# Patient Record
Sex: Female | Born: 1985 | Race: White | Hispanic: No | Marital: Married | Smoking: Former smoker
Health system: Southern US, Community
[De-identification: ages and names within clinical notes are randomized; demographics above are authoritative.]

## PROBLEM LIST (undated history)

## (undated) DIAGNOSIS — R059 Cough, unspecified: Secondary | ICD-10-CM

## (undated) DIAGNOSIS — S42009A Fracture of unspecified part of unspecified clavicle, initial encounter for closed fracture: Secondary | ICD-10-CM

## (undated) DIAGNOSIS — M7989 Other specified soft tissue disorders: Secondary | ICD-10-CM

## (undated) DIAGNOSIS — S62102A Fracture of unspecified carpal bone, left wrist, initial encounter for closed fracture: Secondary | ICD-10-CM

## (undated) DIAGNOSIS — R1110 Vomiting, unspecified: Secondary | ICD-10-CM

## (undated) DIAGNOSIS — R6883 Chills (without fever): Secondary | ICD-10-CM

## (undated) DIAGNOSIS — S62101A Fracture of unspecified carpal bone, right wrist, initial encounter for closed fracture: Secondary | ICD-10-CM

## (undated) DIAGNOSIS — R05 Cough: Secondary | ICD-10-CM

## (undated) DIAGNOSIS — J029 Acute pharyngitis, unspecified: Secondary | ICD-10-CM

## (undated) DIAGNOSIS — R531 Weakness: Secondary | ICD-10-CM

## (undated) HISTORY — DX: Chills (without fever): R68.83

## (undated) HISTORY — PX: NO PAST SURGERIES: SHX2092

## (undated) HISTORY — DX: Acute pharyngitis, unspecified: J02.9

## (undated) HISTORY — DX: Other specified soft tissue disorders: M79.89

## (undated) HISTORY — DX: Cough, unspecified: R05.9

## (undated) HISTORY — DX: Vomiting, unspecified: R11.10

## (undated) HISTORY — DX: Cough: R05

## (undated) HISTORY — DX: Weakness: R53.1

---
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
## 2010-09-30 ENCOUNTER — Ambulatory Visit: Payer: Self-pay | Admitting: Family Medicine

## 2011-06-16 ENCOUNTER — Emergency Department (HOSPITAL_COMMUNITY): Payer: No Typology Code available for payment source

## 2011-06-16 ENCOUNTER — Inpatient Hospital Stay (HOSPITAL_COMMUNITY)
Admission: EM | Admit: 2011-06-16 | Discharge: 2011-06-26 | DRG: 958 | Disposition: A | Discharge status: Home / Self Care | Payer: No Typology Code available for payment source | Attending: General Surgery | Admitting: General Surgery

## 2011-06-16 ENCOUNTER — Inpatient Hospital Stay (HOSPITAL_COMMUNITY): Payer: No Typology Code available for payment source

## 2011-06-16 ENCOUNTER — Encounter: Payer: Self-pay | Admitting: *Deleted

## 2011-06-16 DIAGNOSIS — S42002A Fracture of unspecified part of left clavicle, initial encounter for closed fracture: Secondary | ICD-10-CM | POA: Diagnosis present

## 2011-06-16 DIAGNOSIS — S83509A Sprain of unspecified cruciate ligament of unspecified knee, initial encounter: Secondary | ICD-10-CM | POA: Diagnosis present

## 2011-06-16 DIAGNOSIS — S069X9A Unspecified intracranial injury with loss of consciousness of unspecified duration, initial encounter: Secondary | ICD-10-CM | POA: Diagnosis present

## 2011-06-16 DIAGNOSIS — M25539 Pain in unspecified wrist: Secondary | ICD-10-CM | POA: Diagnosis present

## 2011-06-16 DIAGNOSIS — S069XAA Unspecified intracranial injury with loss of consciousness status unknown, initial encounter: Secondary | ICD-10-CM | POA: Diagnosis present

## 2011-06-16 DIAGNOSIS — S0180XA Unspecified open wound of other part of head, initial encounter: Secondary | ICD-10-CM

## 2011-06-16 DIAGNOSIS — S0291XB Unspecified fracture of skull, initial encounter for open fracture: Secondary | ICD-10-CM | POA: Diagnosis present

## 2011-06-16 DIAGNOSIS — S83519A Sprain of anterior cruciate ligament of unspecified knee, initial encounter: Secondary | ICD-10-CM | POA: Diagnosis present

## 2011-06-16 DIAGNOSIS — S32009A Unspecified fracture of unspecified lumbar vertebra, initial encounter for closed fracture: Secondary | ICD-10-CM | POA: Diagnosis present

## 2011-06-16 DIAGNOSIS — D62 Acute posthemorrhagic anemia: Secondary | ICD-10-CM | POA: Diagnosis not present

## 2011-06-16 DIAGNOSIS — S02109B Fracture of base of skull, unspecified side, initial encounter for open fracture: Principal | ICD-10-CM | POA: Diagnosis present

## 2011-06-16 DIAGNOSIS — Y998 Other external cause status: Secondary | ICD-10-CM

## 2011-06-16 DIAGNOSIS — S0100XA Unspecified open wound of scalp, initial encounter: Secondary | ICD-10-CM | POA: Diagnosis present

## 2011-06-16 DIAGNOSIS — S272XXA Traumatic hemopneumothorax, initial encounter: Secondary | ICD-10-CM | POA: Diagnosis present

## 2011-06-16 DIAGNOSIS — S22009A Unspecified fracture of unspecified thoracic vertebra, initial encounter for closed fracture: Secondary | ICD-10-CM | POA: Diagnosis present

## 2011-06-16 DIAGNOSIS — S2249XA Multiple fractures of ribs, unspecified side, initial encounter for closed fracture: Secondary | ICD-10-CM

## 2011-06-16 DIAGNOSIS — S01309A Unspecified open wound of unspecified ear, initial encounter: Secondary | ICD-10-CM | POA: Diagnosis present

## 2011-06-16 DIAGNOSIS — Y92410 Unspecified street and highway as the place of occurrence of the external cause: Secondary | ICD-10-CM

## 2011-06-16 DIAGNOSIS — S2239XA Fracture of one rib, unspecified side, initial encounter for closed fracture: Secondary | ICD-10-CM

## 2011-06-16 DIAGNOSIS — S42009A Fracture of unspecified part of unspecified clavicle, initial encounter for closed fracture: Secondary | ICD-10-CM | POA: Diagnosis present

## 2011-06-16 DIAGNOSIS — S01409A Unspecified open wound of unspecified cheek and temporomandibular area, initial encounter: Secondary | ICD-10-CM | POA: Diagnosis present

## 2011-06-16 DIAGNOSIS — S27329A Contusion of lung, unspecified, initial encounter: Secondary | ICD-10-CM | POA: Diagnosis present

## 2011-06-16 DIAGNOSIS — J939 Pneumothorax, unspecified: Secondary | ICD-10-CM | POA: Diagnosis present

## 2011-06-16 HISTORY — DX: Fracture of unspecified part of unspecified clavicle, initial encounter for closed fracture: S42.009A

## 2011-06-16 HISTORY — DX: Fracture of unspecified carpal bone, right wrist, initial encounter for closed fracture: S62.101A

## 2011-06-16 HISTORY — DX: Fracture of unspecified carpal bone, right wrist, initial encounter for closed fracture: S62.102A

## 2011-06-16 LAB — POCT I-STAT, CHEM 8
BUN: 13 mg/dL (ref 6–23)
Chloride: 107 mEq/L (ref 96–112)
Creatinine, Ser: 0.7 mg/dL (ref 0.50–1.10)
Hemoglobin: 13.9 g/dL (ref 12.0–15.0)
Potassium: 3.6 mEq/L (ref 3.5–5.1)
Sodium: 141 mEq/L (ref 135–145)

## 2011-06-16 LAB — COMPREHENSIVE METABOLIC PANEL
ALT: 75 U/L — ABNORMAL HIGH (ref 0–35)
Alkaline Phosphatase: 48 U/L (ref 39–117)
BUN: 13 mg/dL (ref 6–23)
CO2: 23 mEq/L (ref 19–32)
Calcium: 8.2 mg/dL — ABNORMAL LOW (ref 8.4–10.5)
GFR calc Af Amer: >90 mL/min (ref >90)
GFR calc non Af Amer: >90 mL/min (ref >90)
Glucose, Bld: 144 mg/dL — ABNORMAL HIGH (ref 70–99)
Sodium: 139 mEq/L (ref 135–145)
Total Protein: 6.3 g/dL (ref 6.0–8.3)

## 2011-06-16 LAB — CBC
Hemoglobin: 13.3 g/dL (ref 12.0–15.0)
MCHC: 34.1 g/dL (ref 30.0–36.0)
RDW: 12.5 % (ref 11.5–15.5)
WBC: 15.3 10*3/uL — ABNORMAL HIGH (ref 4.0–10.5)

## 2011-06-16 LAB — URINALYSIS, ROUTINE W REFLEX MICROSCOPIC
Bilirubin Urine: NEGATIVE
Glucose, UA: NEGATIVE mg/dL
Specific Gravity, Urine: 1.035 — ABNORMAL HIGH (ref 1.005–1.030)

## 2011-06-16 LAB — PROTIME-INR
INR: 1.23 (ref 0.00–1.49)
Prothrombin Time: 15.8 seconds — ABNORMAL HIGH (ref 11.6–15.2)

## 2011-06-16 LAB — URINE MICROSCOPIC-ADD ON

## 2011-06-16 LAB — SAMPLE TO BLOOD BANK

## 2011-06-16 LAB — POCT PREGNANCY, URINE: Preg Test, Ur: NEGATIVE

## 2011-06-16 MED ORDER — DIPHENHYDRAMINE HCL 50 MG/ML IJ SOLN
12.5000 mg | Freq: Four times a day (QID) | INTRAMUSCULAR | Status: DC | PRN
  Administered 2011-06-16: 12.5 mg via INTRAVENOUS
  Filled 2011-06-16: qty 1

## 2011-06-16 MED ORDER — PANTOPRAZOLE SODIUM 40 MG PO TBEC
40.0000 mg | DELAYED_RELEASE_TABLET | Freq: Every day | ORAL | Status: DC
  Filled 2011-06-16: qty 1

## 2011-06-16 MED ORDER — CEFAZOLIN SODIUM 1-5 GM-% IV SOLN: 1.0000 g | INTRAVENOUS | Status: DC

## 2011-06-16 MED ORDER — ONDANSETRON HCL 4 MG/2ML IJ SOLN
4.0000 mg | Freq: Four times a day (QID) | INTRAMUSCULAR | Status: DC | PRN
  Administered 2011-06-16: 4 mg via INTRAVENOUS

## 2011-06-16 MED ORDER — FENTANYL CITRATE 0.05 MG/ML IJ SOLN
50.0000 ug | Freq: Once | INTRAMUSCULAR | Status: AC
  Administered 2011-06-16: 50 ug via INTRAVENOUS

## 2011-06-16 MED ORDER — VANCOMYCIN HCL IN DEXTROSE 1-5 GM/200ML-% IV SOLN
1000.0000 mg | Freq: Three times a day (TID) | INTRAVENOUS | Status: AC
  Administered 2011-06-16 – 2011-06-18 (×5): 1000 mg via INTRAVENOUS
  Filled 2011-06-16 (×5): qty 200

## 2011-06-16 MED ORDER — ONDANSETRON HCL 4 MG PO TABS
4.0000 mg | ORAL_TABLET | Freq: Four times a day (QID) | ORAL | Status: DC | PRN
  Administered 2011-06-20: 4 mg via ORAL
  Filled 2011-06-16: qty 1

## 2011-06-16 MED ORDER — ONDANSETRON HCL 4 MG/2ML IJ SOLN
4.0000 mg | Freq: Four times a day (QID) | INTRAMUSCULAR | Status: DC | PRN
  Administered 2011-06-17 (×3): 4 mg via INTRAVENOUS
  Filled 2011-06-16 (×3): qty 2

## 2011-06-16 MED ORDER — VANCOMYCIN HCL IN DEXTROSE 1-5 GM/200ML-% IV SOLN: 1000.0000 mg | INTRAVENOUS | Status: AC

## 2011-06-16 MED ORDER — KCL IN DEXTROSE-NACL 20-5-0.9 MEQ/L-%-% IV SOLN
INTRAVENOUS | Status: DC
  Administered 2011-06-17 (×2): via INTRAVENOUS
  Administered 2011-06-17 – 2011-06-18 (×2): 125 mL/h via INTRAVENOUS
  Administered 2011-06-18: 18:00:00 via INTRAVENOUS
  Administered 2011-06-19: 125 mL/h via INTRAVENOUS
  Filled 2011-06-16 (×10): qty 1000

## 2011-06-16 MED ORDER — PANTOPRAZOLE SODIUM 40 MG IV SOLR: 40.0000 mg | Freq: Every day | INTRAVENOUS | Status: DC

## 2011-06-16 MED ORDER — PANTOPRAZOLE SODIUM 40 MG IV SOLR
40.0000 mg | Freq: Every day | INTRAVENOUS | Status: DC
  Administered 2011-06-16 – 2011-06-17 (×2): 40 mg via INTRAVENOUS
  Filled 2011-06-16 (×5): qty 40

## 2011-06-16 MED ORDER — HYDROMORPHONE HCL PF 1 MG/ML IJ SOLN
1.0000 mg | Freq: Once | INTRAMUSCULAR | Status: AC
  Administered 2011-06-16 (×2): 1 mg via INTRAVENOUS
  Filled 2011-06-16: qty 1

## 2011-06-16 MED ORDER — TETANUS-DIPHTH-ACELL PERTUSSIS 5-2.5-18.5 LF-MCG/0.5 IM SUSP
0.5000 mL | Freq: Once | INTRAMUSCULAR | Status: AC
  Administered 2011-06-16: 0.5 mL via INTRAMUSCULAR
  Filled 2011-06-16: qty 0.5

## 2011-06-16 MED ORDER — MORPHINE SULFATE (PF) 1 MG/ML IV SOLN
INTRAVENOUS | Status: DC
  Administered 2011-06-16: 19:00:00 via INTRAVENOUS
  Administered 2011-06-16: 3 mg via INTRAVENOUS
  Administered 2011-06-17: 13.5 mg via INTRAVENOUS
  Administered 2011-06-17: 11 mg via INTRAVENOUS
  Administered 2011-06-17 (×2): via INTRAVENOUS
  Administered 2011-06-17: 9 mg via INTRAVENOUS
  Administered 2011-06-17: 16.5 mg via INTRAVENOUS
  Administered 2011-06-18: 16:00:00 via INTRAVENOUS
  Administered 2011-06-18: 3 mg via INTRAVENOUS
  Administered 2011-06-18: 13.5 mg via INTRAVENOUS
  Administered 2011-06-18: 4.5 mg via INTRAVENOUS
  Administered 2011-06-18 – 2011-06-19 (×2): via INTRAVENOUS
  Administered 2011-06-19 (×2): 15 mg via INTRAVENOUS
  Administered 2011-06-19: 7.1 mg via INTRAVENOUS
  Filled 2011-06-16 (×7): qty 25

## 2011-06-16 MED ORDER — ONDANSETRON HCL 4 MG/2ML IJ SOLN
INTRAMUSCULAR | Status: AC
  Filled 2011-06-16: qty 2

## 2011-06-16 MED ORDER — SODIUM CHLORIDE 0.9 % IJ SOLN: 9.0000 mL | INTRAMUSCULAR | Status: DC | PRN

## 2011-06-16 MED ORDER — PANTOPRAZOLE SODIUM 40 MG PO TBEC: 40.0000 mg | DELAYED_RELEASE_TABLET | Freq: Every day | ORAL | Status: DC

## 2011-06-16 MED ORDER — NALOXONE HCL 0.4 MG/ML IJ SOLN: 0.4000 mg | INTRAMUSCULAR | Status: DC | PRN

## 2011-06-16 MED ORDER — HYDROMORPHONE HCL PF 1 MG/ML IJ SOLN
1.0000 mg | INTRAMUSCULAR | Status: DC | PRN
  Administered 2011-06-16: 1 mg via INTRAVENOUS
  Filled 2011-06-16 (×2): qty 1

## 2011-06-16 MED ORDER — DIPHENHYDRAMINE HCL 12.5 MG/5ML PO ELIX
12.5000 mg | ORAL_SOLUTION | Freq: Four times a day (QID) | ORAL | Status: DC | PRN
  Filled 2011-06-16: qty 5

## 2011-06-16 MED ORDER — SODIUM CHLORIDE 0.9 % IV SOLN: Freq: Once | INTRAVENOUS | Status: DC

## 2011-06-16 MED ORDER — IOHEXOL 300 MG/ML  SOLN
100.0000 mL | Freq: Once | INTRAMUSCULAR | Status: AC | PRN
  Administered 2011-06-16: 100 mL via INTRAVENOUS

## 2011-06-16 MED ORDER — FENTANYL CITRATE 0.05 MG/ML IJ SOLN
50.0000 ug | Freq: Once | INTRAMUSCULAR | Status: AC
  Administered 2011-06-16: 50 ug via INTRAVENOUS
  Filled 2011-06-16: qty 2

## 2011-06-16 MED ORDER — VANCOMYCIN HCL IN DEXTROSE 1-5 GM/200ML-% IV SOLN: 1000.0000 mg | Freq: Three times a day (TID) | INTRAVENOUS | Status: DC
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
## 2011-06-16 NOTE — ED Notes (Signed)
Dr. Newman at bedside. 
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
## 2011-06-16 NOTE — ED Notes (Signed)
Report called to 2300. MD Ezzard Standing at bedside repairing laceration to head. Will transport pt after repair is finished.
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
## 2011-06-16 NOTE — Progress Notes (Signed)
Paged Dr. Andrey Campanile to clarify imaging, activity, and diet orders. PCXR all right to be done in AM before going to radiology for CT of head and flexion/extension of cervical spine. Okay for flex/ext order to be moved to the AM to be done with CT of head. No activity orders except for bedrest in. Order given to elevate head of bed and to logroll patient. No lumbar precautions necessary per MD. Patient put on a clear liquid diet. Her primary complaint since admission to 2300 has been hunger.

## 2011-06-16 NOTE — Consult Note (Signed)
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 shaft fracture, OTA 15-B3  Given profound comminution as well as ipsilateral rib fractures we would strongly recommend open reduction internal fixation to help restore stability length and alignment.  We did review with the patient that allowing the clavicle to heal in its current position or even a more shortened position she likely sustained decreased endurance and strength in the extremity.  Given the multiple ipsilateral rib fractures with anticipate further displacement over the next several days interim or unacceptable position.  Also patient is left-hand dominant which would also favor operative fixation at this time.  We will obtain a dedicated clavicle x-rays to fully evaluate as well.  Patient can have a sling for comfort  Ice as needed for swelling and pain control. 3. suspected right knee anterior cruciate ligament and MCL injury  Patient's clinical exam as well as radiographic findings are concerning for injuries to both the anterior cruciate ligament as well as the MCL  We will obtain an MRI once patient is stable enough to do so of the right knee to evaluate for soft tissue injury.   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 4. Back and pelvic pain  Initial clinical exam was concerning for possible pelvic ring injury  No evidence of a sacral fracture or rami fractures are noted.  Is possible that the left L2-L4 transverse processes fractures are reflective of the iliopsoas avulsion injury and could explain her back pain.  4. DVT PE prophylaxis  Per trauma service 5. Activity  Bed rest 6. Disposition  Followup left clavicle films  Eventual MRI right knee later on during admission  Admission for close observation  If patient elects to have surgery, we will perform likely Thursday.    Mearl Latin, PA-C  06/16/2011, 1:45 PM
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
## 2011-06-16 NOTE — ED Notes (Signed)
MD at bedside. 

## 2011-06-16 NOTE — ED Notes (Signed)
Trauma MD at bedside.
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
## 2011-06-17 ENCOUNTER — Inpatient Hospital Stay (HOSPITAL_COMMUNITY): Payer: No Typology Code available for payment source

## 2011-06-17 ENCOUNTER — Encounter (HOSPITAL_COMMUNITY): Payer: Self-pay | Admitting: Radiology

## 2011-06-17 DIAGNOSIS — D62 Acute posthemorrhagic anemia: Secondary | ICD-10-CM | POA: Diagnosis not present

## 2011-06-17 LAB — BASIC METABOLIC PANEL
BUN: 4 mg/dL — ABNORMAL LOW (ref 6–23)
Calcium: 7.9 mg/dL — ABNORMAL LOW (ref 8.4–10.5)
Creatinine, Ser: 0.52 mg/dL (ref 0.50–1.10)
GFR calc non Af Amer: >90 mL/min (ref >90)
Glucose, Bld: 139 mg/dL — ABNORMAL HIGH (ref 70–99)

## 2011-06-17 LAB — CBC
HCT: 33.1 % — ABNORMAL LOW (ref 36.0–46.0)
Hemoglobin: 11.4 g/dL — ABNORMAL LOW (ref 12.0–15.0)
MCH: 31.9 pg (ref 26.0–34.0)
MCHC: 34.4 g/dL (ref 30.0–36.0)
RDW: 12.6 % (ref 11.5–15.5)

## 2011-06-17 LAB — VANCOMYCIN, TROUGH: Vancomycin Tr: 11.7 ug/mL (ref 10.0–20.0)

## 2011-06-17 MED ORDER — METAXALONE 800 MG PO TABS
800.0000 mg | ORAL_TABLET | Freq: Four times a day (QID) | ORAL | Status: DC
  Administered 2011-06-17 – 2011-06-19 (×6): 800 mg via ORAL
  Filled 2011-06-17 (×12): qty 1

## 2011-06-17 MED ORDER — VANCOMYCIN HCL IN DEXTROSE 1-5 GM/200ML-% IV SOLN
1000.0000 mg | INTRAVENOUS | Status: AC
  Filled 2011-06-17: qty 200

## 2011-06-17 MED ORDER — OXYCODONE HCL 5 MG PO TABS
10.0000 mg | ORAL_TABLET | ORAL | Status: DC | PRN
  Administered 2011-06-17: 10 mg via ORAL
  Administered 2011-06-19 (×2): 5 mg via ORAL
  Administered 2011-06-20 (×2): 10 mg via ORAL
  Filled 2011-06-17 (×2): qty 2
  Filled 2011-06-17: qty 1
  Filled 2011-06-17 (×3): qty 2

## 2011-06-17 MED ORDER — MORPHINE SULFATE 4 MG/ML IJ SOLN
4.0000 mg | INTRAMUSCULAR | Status: DC | PRN
  Administered 2011-06-17: 2 mg via INTRAVENOUS
  Administered 2011-06-19 – 2011-06-26 (×6): 4 mg via INTRAVENOUS
  Filled 2011-06-17 (×8): qty 1

## 2011-06-17 MED ORDER — BACITRACIN ZINC 500 UNIT/GM EX OINT
TOPICAL_OINTMENT | CUTANEOUS | Status: DC | PRN
  Filled 2011-06-17: qty 15

## 2011-06-17 MED ORDER — VANCOMYCIN HCL IN DEXTROSE 1-5 GM/200ML-% IV SOLN: 1000.0000 mg | Freq: Once | INTRAVENOUS | Status: DC

## 2011-06-17 MED ORDER — TRAMADOL HCL 50 MG PO TABS
100.0000 mg | ORAL_TABLET | Freq: Four times a day (QID) | ORAL | Status: DC
  Administered 2011-06-17 – 2011-06-22 (×19): 100 mg via ORAL
  Administered 2011-06-22: 50 mg via ORAL
  Administered 2011-06-22 – 2011-06-26 (×15): 100 mg via ORAL
  Filled 2011-06-17 (×19): qty 2
  Filled 2011-06-17: qty 1
  Filled 2011-06-17 (×9): qty 2
  Filled 2011-06-17: qty 1
  Filled 2011-06-17 (×12): qty 2

## 2011-06-17 MED ORDER — VANCOMYCIN HCL IN DEXTROSE 1-5 GM/200ML-% IV SOLN
1000.0000 mg | Freq: Three times a day (TID) | INTRAVENOUS | Status: DC
  Administered 2011-06-17 – 2011-06-21 (×11): 1000 mg via INTRAVENOUS
  Filled 2011-06-17 (×12): qty 200

## 2011-06-17 MED ORDER — BACITRACIN ZINC 500 UNIT/GM EX OINT
TOPICAL_OINTMENT | Freq: Two times a day (BID) | CUTANEOUS | Status: DC
  Administered 2011-06-17: 1 via TOPICAL
  Administered 2011-06-17: 12:00:00 via TOPICAL
  Administered 2011-06-18: 1 via TOPICAL
  Administered 2011-06-18 – 2011-06-26 (×15): via TOPICAL
  Filled 2011-06-17 (×3): qty 15
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
## 2011-06-17 NOTE — Progress Notes (Signed)
Patient back from radiology with RN and transporter. CT of head and chest xray completed. Patient unable to complete flexion and extension images of cervical spine due to pain and anxiety r/t the movement of the head of the bed. Patient reached a 45 degree angle and refused anymore movement upward. Patient c/o pain with moving during CT and CXR, but overall tolerated well. Placed on 3L nasal cannula during CT. Transported on RA in bed, VSS stable, zofran given for nausea during transport, and Morphine PCA utilized for pain.
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
## 2011-06-17 NOTE — Progress Notes (Signed)
Remain in ICU  Wilmon Arms. Corliss Skains, MD, [facility]  06/17/2011 11:52 AM
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
## 2011-06-18 ENCOUNTER — Inpatient Hospital Stay (HOSPITAL_COMMUNITY): Payer: No Typology Code available for payment source

## 2011-06-18 ENCOUNTER — Encounter (HOSPITAL_COMMUNITY): Admission: EM | Disposition: A | Discharge status: Home / Self Care | Payer: Self-pay | Source: Home / Self Care

## 2011-06-18 ENCOUNTER — Encounter (HOSPITAL_COMMUNITY): Payer: Self-pay | Admitting: Anesthesiology

## 2011-06-18 ENCOUNTER — Inpatient Hospital Stay (HOSPITAL_COMMUNITY): Payer: No Typology Code available for payment source | Admitting: Anesthesiology

## 2011-06-18 HISTORY — PX: ORIF CLAVICULAR FRACTURE: SHX5055

## 2011-06-18 LAB — BASIC METABOLIC PANEL
BUN: 4 mg/dL — ABNORMAL LOW (ref 6–23)
Chloride: 103 mEq/L (ref 96–112)
Creatinine, Ser: 0.56 mg/dL (ref 0.50–1.10)
Glucose, Bld: 132 mg/dL — ABNORMAL HIGH (ref 70–99)
Potassium: 3.7 mEq/L (ref 3.5–5.1)

## 2011-06-18 LAB — CBC
HCT: 31.6 % — ABNORMAL LOW (ref 36.0–46.0)
Hemoglobin: 10.5 g/dL — ABNORMAL LOW (ref 12.0–15.0)
MCHC: 33.2 g/dL (ref 30.0–36.0)
MCV: 94.9 fL (ref 78.0–100.0)
WBC: 9 10*3/uL (ref 4.0–10.5)

## 2011-06-18 SURGERY — OPEN REDUCTION INTERNAL FIXATION (ORIF) CLAVICULAR FRACTURE
Anesthesia: General | Site: Shoulder | Laterality: Right | Wound class: Clean

## 2011-06-18 MED ORDER — METOCLOPRAMIDE HCL 5 MG/ML IJ SOLN
5.0000 mg | Freq: Three times a day (TID) | INTRAMUSCULAR | Status: DC | PRN
  Filled 2011-06-18: qty 2

## 2011-06-18 MED ORDER — METOCLOPRAMIDE HCL 5 MG PO TABS
5.0000 mg | ORAL_TABLET | Freq: Three times a day (TID) | ORAL | Status: DC | PRN
  Filled 2011-06-18: qty 2

## 2011-06-18 MED ORDER — LORAZEPAM 2 MG/ML IJ SOLN: 1.0000 mg | Freq: Once | INTRAMUSCULAR | Status: AC | PRN

## 2011-06-18 MED ORDER — PROPOFOL 10 MG/ML IV EMUL
INTRAVENOUS | Status: DC | PRN
  Administered 2011-06-18: 200 mg via INTRAVENOUS

## 2011-06-18 MED ORDER — PROMETHAZINE HCL 25 MG/ML IJ SOLN: 6.2500 mg | INTRAMUSCULAR | Status: DC | PRN

## 2011-06-18 MED ORDER — HYDROMORPHONE HCL PF 1 MG/ML IJ SOLN
0.2500 mg | INTRAMUSCULAR | Status: DC | PRN
  Administered 2011-06-18: 0.5 mg via INTRAVENOUS

## 2011-06-18 MED ORDER — ONDANSETRON HCL 4 MG PO TABS: 4.0000 mg | ORAL_TABLET | Freq: Four times a day (QID) | ORAL | Status: DC | PRN

## 2011-06-18 MED ORDER — LORAZEPAM 2 MG/ML IJ SOLN: 1.0000 mg | Freq: Once | INTRAMUSCULAR | Status: DC | PRN

## 2011-06-18 MED ORDER — DEXAMETHASONE SODIUM PHOSPHATE 4 MG/ML IJ SOLN
INTRAMUSCULAR | Status: DC | PRN
  Administered 2011-06-18: 8 mg via INTRAVENOUS

## 2011-06-18 MED ORDER — NEOSTIGMINE METHYLSULFATE 1 MG/ML IJ SOLN
INTRAMUSCULAR | Status: DC | PRN
  Administered 2011-06-18: 5 mg via INTRAVENOUS

## 2011-06-18 MED ORDER — PHENYLEPHRINE HCL 10 MG/ML IJ SOLN
INTRAMUSCULAR | Status: DC | PRN
  Administered 2011-06-18: 40 ug via INTRAVENOUS
  Administered 2011-06-18 (×2): 80 ug via INTRAVENOUS

## 2011-06-18 MED ORDER — GLYCOPYRROLATE 0.2 MG/ML IJ SOLN
INTRAMUSCULAR | Status: DC | PRN
  Administered 2011-06-18: .6 mg via INTRAVENOUS

## 2011-06-18 MED ORDER — MIDAZOLAM HCL 5 MG/5ML IJ SOLN
INTRAMUSCULAR | Status: DC | PRN
  Administered 2011-06-18: 2 mg via INTRAVENOUS

## 2011-06-18 MED ORDER — BUPIVACAINE-EPINEPHRINE PF 0.25-1:200000 % IJ SOLN
INTRAMUSCULAR | Status: DC | PRN
  Administered 2011-06-18: 5 mL

## 2011-06-18 MED ORDER — DROPERIDOL 2.5 MG/ML IJ SOLN
INTRAMUSCULAR | Status: DC | PRN
  Administered 2011-06-18: 0.625 mg via INTRAVENOUS

## 2011-06-18 MED ORDER — FENTANYL CITRATE 0.05 MG/ML IJ SOLN
INTRAMUSCULAR | Status: DC | PRN
  Administered 2011-06-18 (×2): 75 ug via INTRAVENOUS
  Administered 2011-06-18: 100 ug via INTRAVENOUS

## 2011-06-18 MED ORDER — LACTATED RINGERS IV SOLN
INTRAVENOUS | Status: DC | PRN
  Administered 2011-06-18 (×3): via INTRAVENOUS

## 2011-06-18 MED ORDER — METOCLOPRAMIDE HCL 5 MG/ML IJ SOLN
INTRAMUSCULAR | Status: DC | PRN
  Administered 2011-06-18: 10 mg via INTRAVENOUS

## 2011-06-18 MED ORDER — ONDANSETRON HCL 4 MG/2ML IJ SOLN: 4.0000 mg | Freq: Four times a day (QID) | INTRAMUSCULAR | Status: DC | PRN

## 2011-06-18 MED ORDER — ROCURONIUM BROMIDE 100 MG/10ML IV SOLN
INTRAVENOUS | Status: DC | PRN
  Administered 2011-06-18: 50 mg via INTRAVENOUS

## 2011-06-18 MED ORDER — SODIUM CHLORIDE 0.9 % IR SOLN
Status: DC | PRN
  Administered 2011-06-18: 1000 mL

## 2011-06-18 MED ORDER — HYDROMORPHONE HCL PF 1 MG/ML IJ SOLN: 0.2500 mg | INTRAMUSCULAR | Status: DC | PRN

## 2011-06-18 MED ORDER — ONDANSETRON HCL 4 MG/2ML IJ SOLN
INTRAMUSCULAR | Status: DC | PRN
  Administered 2011-06-18: 4 mg via INTRAVENOUS

## 2011-06-18 SURGICAL SUPPLY — 67 items
2.7x12mm locking screw ×3 IMPLANT
BIT DRILL 100X2XQC STRL (BIT) ×2 IMPLANT
BIT DRILL 110MM 85MM (BIT) ×2 IMPLANT
BIT DRILL 2.0 LNG QUCK RELEASE (BIT) ×2 IMPLANT
BIT DRILL QC 2.0X100 (BIT) ×1
BIT DRL 100X2XQC STRL (BIT) ×2
BRUSH SCRUB DISP (MISCELLANEOUS) ×6 IMPLANT
CLOTH BEACON ORANGE TIMEOUT ST (SAFETY) ×3 IMPLANT
COVER SURGICAL LIGHT HANDLE (MISCELLANEOUS) ×3 IMPLANT
DRAPE C-ARM 42X72 X-RAY (DRAPES) ×3 IMPLANT
DRAPE C-ARMOR (DRAPES) IMPLANT
DRAPE INCISE IOBAN 66X45 STRL (DRAPES) ×3 IMPLANT
DRAPE ORTHO SPLIT 77X108 STRL (DRAPES) ×1
DRAPE SURG ORHT 6 SPLT 77X108 (DRAPES) ×2 IMPLANT
DRAPE U-SHAPE 47X51 STRL (DRAPES) ×6 IMPLANT
DRILL 2.0 LNG QUICK RELEASE (BIT) ×3
DRILL BIT 110MM/85MM (BIT) ×1
DRSG EMULSION OIL 3X3 NADH (GAUZE/BANDAGES/DRESSINGS) ×3 IMPLANT
ELECT NEEDLE TIP 2.8 STRL (NEEDLE) ×3 IMPLANT
ELECT REM PT RETURN 9FT ADLT (ELECTROSURGICAL) ×3
ELECTRODE REM PT RTRN 9FT ADLT (ELECTROSURGICAL) ×2 IMPLANT
GLOVE BIO SURGEON STRL SZ7.5 (GLOVE) ×3 IMPLANT
GLOVE BIO SURGEON STRL SZ8 (GLOVE) ×3 IMPLANT
GLOVE BIOGEL PI IND STRL 6.5 (GLOVE) ×2 IMPLANT
GLOVE BIOGEL PI IND STRL 7.5 (GLOVE) ×2 IMPLANT
GLOVE BIOGEL PI IND STRL 8 (GLOVE) ×2 IMPLANT
GLOVE BIOGEL PI INDICATOR 6.5 (GLOVE) ×1
GLOVE BIOGEL PI INDICATOR 7.5 (GLOVE) ×1
GLOVE BIOGEL PI INDICATOR 8 (GLOVE) ×1
GLOVE ECLIPSE 6.5 STRL STRAW (GLOVE) ×3 IMPLANT
GOWN PREVENTION PLUS XLARGE (GOWN DISPOSABLE) ×3 IMPLANT
GOWN STRL NON-REIN LRG LVL3 (GOWN DISPOSABLE) ×6 IMPLANT
KIT BASIN OR (CUSTOM PROCEDURE TRAY) ×3 IMPLANT
KIT ROOM TURNOVER OR (KITS) ×3 IMPLANT
MANIFOLD NEPTUNE II (INSTRUMENTS) ×3 IMPLANT
NEEDLE HYPO 25GX1X1/2 BEV (NEEDLE) ×3 IMPLANT
NS IRRIG 1000ML POUR BTL (IV SOLUTION) ×3 IMPLANT
PACK FOAM VITOSS 5CC (Orthopedic Implant) ×3 IMPLANT
PACK TOTAL JOINT (CUSTOM PROCEDURE TRAY) ×3 IMPLANT
PAD ARMBOARD 7.5X6 YLW CONV (MISCELLANEOUS) ×6 IMPLANT
PLATE CLAVICLE 10 HOLE (Plate) ×3 IMPLANT
PLATE LC DCP 2.0 6H (Plate) ×3 IMPLANT
SCREW 2.7MMX10.0MM (Screw) ×3 IMPLANT
SCREW 2.7MMX12.0MM (Screw) ×6 IMPLANT
SCREW 2.7MMX14.0MM (Screw) ×3 IMPLANT
SCREW BONE 2.7X16MM (Screw) ×3 IMPLANT
SCREW BONE 2.7X18MM (Screw) ×3 IMPLANT
SCREW CORTEX ST 2.0X10 (Screw) ×3 IMPLANT
SCREW CORTEX ST 2.0X12 (Screw) ×6 IMPLANT
SCREW CORTEX ST 2.0X16 (Screw) ×3 IMPLANT
SCREW CORTEX ST 2.0X8 (Screw) ×3 IMPLANT
SCREW LOCK 2.7X14MM (Screw) ×3 IMPLANT
SPONGE GAUZE 4X4 12PLY (GAUZE/BANDAGES/DRESSINGS) ×3 IMPLANT
SPONGE LAP 18X18 X RAY DECT (DISPOSABLE) ×6 IMPLANT
STRIP CLOSURE SKIN 1/2X4 (GAUZE/BANDAGES/DRESSINGS) ×3 IMPLANT
SUCTION FRAZIER TIP 10 FR DISP (SUCTIONS) ×3 IMPLANT
SUT ETHILON 3 0 PS 1 (SUTURE) IMPLANT
SUT MNCRL AB 4-0 PS2 18 (SUTURE) ×3 IMPLANT
SUT PROLENE 3 0 PS 1 (SUTURE) IMPLANT
SUT VIC AB 0 CT1 27 (SUTURE) ×1
SUT VIC AB 0 CT1 27XBRD ANBCTR (SUTURE) ×2 IMPLANT
SUT VIC AB 2-0 CT1 27 (SUTURE) ×1
SUT VIC AB 2-0 CT1 TAPERPNT 27 (SUTURE) ×2 IMPLANT
SUT VIC AB 2-0 CT3 27 (SUTURE) IMPLANT
SYR CONTROL 10ML LL (SYRINGE) ×3 IMPLANT
WATER STERILE IRR 1000ML POUR (IV SOLUTION) ×3 IMPLANT
YANKAUER SUCT BULB TIP NO VENT (SUCTIONS) ×3 IMPLANT
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
## 2011-06-18 NOTE — Consult Note (Signed)
I have saw and examined the patient together with Montez Morita. He scribed for me. Recommended repair of left, dominant clavicle and aspiration of right knee if patient goes to OR; probable MRI. Repeat survey tomorrow.  Krishay Faro H 06/18/2011 11:35 AM

## 2011-06-18 NOTE — Progress Notes (Signed)
I saw and examined the patient in conjunction with Montez Morita. He scribed for me. Surgical repair of dominant left side recommended and anticipated. Aspiration of right knee if goes to OR, probable MRI; repeat survey tomorrow.  Sarah Wood H 06/18/2011 11:32 AM

## 2011-06-18 NOTE — Anesthesia Preprocedure Evaluation (Addendum)
Anesthesia Evaluation Anesthesia Physical Anesthesia Plan  ASA: III  Anesthesia Plan: General   Post-op Pain Management:    Induction:   Airway Management Planned:   Additional Equipment:   Intra-op Plan:   Post-operative Plan:   Informed Consent:   Plan Discussed with:   Anesthesia Plan Comments:        Anesthesia Quick Evaluation
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
## 2011-06-18 NOTE — Preoperative (Signed)
Beta Blockers   Reason not to administer Beta Blockers:Not Applicable 
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
## 2011-06-19 MED ORDER — METHOCARBAMOL 750 MG PO TABS
1500.0000 mg | ORAL_TABLET | Freq: Four times a day (QID) | ORAL | Status: DC
  Administered 2011-06-19 – 2011-06-21 (×8): 1500 mg via ORAL
  Filled 2011-06-19 (×12): qty 2

## 2011-06-19 MED ORDER — LACTATED RINGERS IV SOLN
INTRAVENOUS | Status: DC
  Administered 2011-06-19: 15:00:00 via INTRAVENOUS
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
## 2011-06-19 NOTE — Progress Notes (Signed)
Orthopedic Tech Progress Note Patient Details:  Sarah Wood 1986/02/19 [id number]  Other Ortho Devices Ortho Device Location: sling (L) UE Ortho Device Interventions: Application   Jennye Moccasin 06/19/2011, 7:08 PM Patient refused frame
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
## 2011-06-19 NOTE — Progress Notes (Signed)
Orthopedic Tech Progress Note Patient Details:  Sarah Wood 1986-07-07 [id number]  Other Ortho Devices Ortho Device Location: sling (L) UE Ortho Device Interventions: Application   Jennye Moccasin 06/19/2011, 7:07 PM

## 2011-06-19 NOTE — Plan of Care (Signed)
Problem: Phase II Progression Outcomes Goal: Progress activity as tolerated unless otherwise ordered Outcome: Progressing PT evaluation completed.  Should progress to home with HHPT and 24 hour care.  Will possibly need a RW and 3N1.  Continue PT.  Thanks. [facility] [phone] (pager)
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
## 2011-06-20 ENCOUNTER — Inpatient Hospital Stay (HOSPITAL_COMMUNITY): Payer: No Typology Code available for payment source

## 2011-06-20 DIAGNOSIS — S83519A Sprain of anterior cruciate ligament of unspecified knee, initial encounter: Secondary | ICD-10-CM | POA: Diagnosis present

## 2011-06-20 DIAGNOSIS — S270XXA Traumatic pneumothorax, initial encounter: Secondary | ICD-10-CM

## 2011-06-20 LAB — CBC
HCT: 30.2 % — ABNORMAL LOW (ref 36.0–46.0)
Hemoglobin: 10.3 g/dL — ABNORMAL LOW (ref 12.0–15.0)
MCH: 31.8 pg (ref 26.0–34.0)
MCHC: 34.1 g/dL (ref 30.0–36.0)
RDW: 12.4 % (ref 11.5–15.5)

## 2011-06-20 LAB — BASIC METABOLIC PANEL
BUN: 7 mg/dL (ref 6–23)
Calcium: 8.4 mg/dL (ref 8.4–10.5)
GFR calc Af Amer: >90 mL/min (ref >90)
GFR calc non Af Amer: >90 mL/min (ref >90)
Glucose, Bld: 79 mg/dL (ref 70–99)
Sodium: 134 mEq/L — ABNORMAL LOW (ref 135–145)

## 2011-06-20 LAB — MRSA PCR SCREENING: MRSA by PCR: NEGATIVE

## 2011-06-20 MED ORDER — FENTANYL CITRATE 0.05 MG/ML IJ SOLN
INTRAMUSCULAR | Status: AC
  Filled 2011-06-20: qty 2

## 2011-06-20 MED ORDER — FENTANYL CITRATE 0.05 MG/ML IJ SOLN
50.0000 ug | INTRAMUSCULAR | Status: DC | PRN
  Administered 2011-06-20: 100 ug via INTRAVENOUS

## 2011-06-20 MED ORDER — MIDAZOLAM HCL 10 MG/2ML IJ SOLN: 0.5000 mg | Freq: Once | INTRAMUSCULAR | Status: DC

## 2011-06-20 MED ORDER — MIDAZOLAM HCL 2 MG/2ML IJ SOLN
INTRAMUSCULAR | Status: AC
  Administered 2011-06-20: 1 mg via INTRAVENOUS
  Filled 2011-06-20: qty 2

## 2011-06-20 MED ORDER — HYDROCODONE-ACETAMINOPHEN 10-325 MG PO TABS
1.0000 | ORAL_TABLET | ORAL | Status: DC | PRN
  Administered 2011-06-20 – 2011-06-23 (×9): 2 via ORAL
  Administered 2011-06-24: 1 via ORAL
  Administered 2011-06-24 – 2011-06-26 (×5): 2 via ORAL
  Filled 2011-06-20 (×16): qty 2

## 2011-06-20 MED ORDER — MIDAZOLAM HCL 2 MG/2ML IJ SOLN: 0.5000 mg | INTRAMUSCULAR | Status: DC | PRN

## 2011-06-20 MED ORDER — FENTANYL CITRATE 0.05 MG/ML IJ SOLN
INTRAMUSCULAR | Status: AC
  Administered 2011-06-20: 100 ug via INTRAVENOUS
  Filled 2011-06-20: qty 2

## 2011-06-20 MED ORDER — FENTANYL CITRATE 0.05 MG/ML IJ SOLN
50.0000 ug | Freq: Once | INTRAMUSCULAR | Status: AC
  Administered 2011-06-20: 25 ug via INTRAVENOUS

## 2011-06-20 MED ORDER — MIDAZOLAM HCL 2 MG/2ML IJ SOLN
INTRAMUSCULAR | Status: AC
  Administered 2011-06-20: 2 mg
  Filled 2011-06-20: qty 2
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
## 2011-06-20 NOTE — Progress Notes (Signed)
Physical Therapy Treatment Patient Details Name: Sarah Wood MRN: [id number] DOB: 1986-05-10 Today's Date: 06/20/2011  PT Assessment/Plan  PT - Assessment/Plan Comments on Treatment Session: Pt lethargic throughout, very limited by pain and nausea today. Pt refusing to perform stairs as she was simply in too much pain. Pt reports she goes to the bathroom without supplemental O2 so O2 monitored with ambulation. Without [place] O2. Pt did drop to SpO2 = 89% on room air however resaturated to 92% with seated rest break. Pt was left on 2 L [place] O2 and 95% SpO2. Pt able to recall set of four numbers given at beginning of session. She likely has some vestibular deficits whether they are concussion vs. peripheral related is uncertain at this time however she may benefit from outpatient PT for vestibular rehab once she can tolerate evaluation.  PT Plan: Discharge plan remains appropriate;Equipment recommendations need to be updated PT Frequency: Min 5X/week Follow Up Recommendations: Home health PT;24 hour supervision/assistance;[facility] ([facility]) Equipment Recommended: 3 in 1 bedside commode - maybe PT Goals  Acute Rehab PT Goals PT Goal Formulation: With patient Time For Goal Achievement: 7 days Pt will go Supine/Side to Sit: with supervision;with HOB not 0 degrees (comment degree) (with no rail) PT Goal: Supine/Side to Sit - Progress: Progressing toward goal Pt will go Sit to Stand: with supervision PT Goal: Sit to Stand - Progress: Met Pt will Ambulate: >150 feet;with supervision;with least restrictive assistive device PT Goal: Ambulate - Progress: Progressing toward goal Pt will Go Up / Down Stairs: 3-5 stairs;with supervision;with least restrictive assistive device PT Goal: Up/Down Stairs - Progress: Progressing toward goal  PT Treatment Precautions/Restrictions  Precautions Precautions: Fall Required Braces or Orthoses: Yes Other  Brace/Splint: Sling left UE Restrictions Weight Bearing Restrictions: No LUE Weight Bearing: Weight bearing as tolerated Mobility (including Balance) Bed Mobility Bed Mobility: Yes Rolling Right: With rail;5: Supervision Rolling Right Details (indicate cue type and reason): Verbal cues for efficiency and pain modulation Right Sidelying to Sit: With rails;HOB elevated (comment degrees) (50 degrees) Right Sidelying to Sit Details (indicate cue type and reason): Pt raised HOB to assist self, educated that she will not have hospital bed at home.  Sitting - Scoot to Edge of Bed: 5: Supervision Sitting - Scoot to [place] of Bed Details (indicate cue type and reason): Verbal cues for sequencing . Sit to Supine - Right: 4: Min assist Sit to Supine - Right Details (indicate cue type and reason): Assist for Bil. LEs, returned to bed initially secondary to nausea.  Transfers Transfers: Yes Sit to Stand: 5: Supervision (pt = 70 %) Sit to Stand Details (indicate cue type and reason): Close supervision secondary to c/o dizziness, verbal cues for safety Stand to Sit: With upper extremity assist;With armrests;To chair/3-in-1;4: Min assist Stand to Sit Details: Verbal cues for hand placement and control of descent Ambulation/Gait Ambulation/Gait: Yes Ambulation/Gait Assistance: 4: Min assist (pt = 70%) Ambulation/Gait Assistance Details (indicate cue type and reason): Assistance secondary to gait instability.  Ambulation Distance (Feet): 40 Feet Assistive device: 1 person hand held assist Gait Pattern: Decreased stride length;Shuffle;Antalgic;Step-through pattern Gait velocity: Slow cadence Stairs: No (Pt refused) Wheelchair Mobility Wheelchair Mobility: No    End of Session PT - End of Session Equipment Utilized During Treatment:  (L.UE sling) Activity Tolerance: Patient limited by fatigue;Patient limited by pain (limited by dizziness) Patient left: in chair;with call bell in reach Nurse  Communication: Mobility status for ambulation (Decreased O2 saturation  with ambulation) General Behavior During Session: Lethargic Cognition: Impaired Cognitive Impairment: Slightly slow processing  Sherie Don 06/20/2011, 9:16 AM  Sherie Don) Carleene Mains PT, DPT Acute Rehabilitation [phone]
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
## 2011-06-20 NOTE — Progress Notes (Signed)
PORTABLE CHEST - 1 VIEW  Comparison: 06/18/2011  Findings: Multiple left-sided rib fractures are again noted.  Internal fixation hardware is again seen on cross left clavicle  fracture.  Increased in size of left pneumothorax is seen which has both  apical and medial components is approximately 30% in size. Small  left pleural effusion again noted as well as left retrocardiac  atelectasis. Right lung remains clear. Heart size is normal.  IMPRESSION:  1. Increase in size of 30% left hemopneumothorax.  2. Stable left retrocardiac atelectasis and multiple left rib  Fractures.  Increasing htpx on cxr, discussed ct placement with patient, will move to stepdown and do under conscious sedation.
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
## 2011-06-21 ENCOUNTER — Inpatient Hospital Stay (HOSPITAL_COMMUNITY): Payer: No Typology Code available for payment source

## 2011-06-21 MED ORDER — MIDAZOLAM HCL 2 MG/2ML IJ SOLN
1.0000 mg | Freq: Once | INTRAMUSCULAR | Status: AC
  Administered 2011-06-20: 1 mg via INTRAVENOUS

## 2011-06-21 MED ORDER — METHOCARBAMOL 500 MG PO TABS
1000.0000 mg | ORAL_TABLET | Freq: Four times a day (QID) | ORAL | Status: DC
  Administered 2011-06-21 – 2011-06-26 (×21): 1000 mg via ORAL
  Filled 2011-06-21 (×19): qty 2
  Filled 2011-06-21: qty 1
  Filled 2011-06-21 (×8): qty 2

## 2011-06-21 MED ORDER — MORPHINE SULFATE CR 15 MG PO TB12
15.0000 mg | ORAL_TABLET | Freq: Two times a day (BID) | ORAL | Status: DC
  Administered 2011-06-21 – 2011-06-26 (×11): 15 mg via ORAL
  Filled 2011-06-21 (×11): qty 1
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
## 2011-06-21 NOTE — Progress Notes (Signed)
The patient was seen, examined and PA note reviewed and data reviewed.  I agree with the plan of action. 

## 2011-06-22 ENCOUNTER — Inpatient Hospital Stay (HOSPITAL_COMMUNITY): Payer: No Typology Code available for payment source

## 2011-06-22 MED ORDER — POLYETHYLENE GLYCOL 3350 17 G PO PACK
17.0000 g | PACK | Freq: Every day | ORAL | Status: DC
  Administered 2011-06-23 – 2011-06-26 (×3): 17 g via ORAL
  Filled 2011-06-22 (×5): qty 1

## 2011-06-22 MED ORDER — DOCUSATE SODIUM 100 MG PO CAPS
100.0000 mg | ORAL_CAPSULE | Freq: Two times a day (BID) | ORAL | Status: DC
  Administered 2011-06-22 – 2011-06-26 (×9): 100 mg via ORAL
  Filled 2011-06-22 (×10): qty 1
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
## 2011-06-22 NOTE — Progress Notes (Signed)
Physical Therapy Treatment Patient Details Name: Sarah Wood MRN: [id number] DOB: 08-02-1985 Today's Date: 06/22/2011  PT Assessment/Plan  PT - Assessment/Plan Comments on Treatment Session: Pt. did very well today and was able to increase ambulation distance and navigate stairs with supervision and no rail.  Pt. did require standing rest after 100 feet of ambulation and ascending/descending 5 stairs due to difficulty breathing.  Pt. reporting pain in chest rated at 7/10 throughout session, increasing to 8/10 after bed mobility exercises.  Instructed pt. in bed mobility technique to minimize pain in her back, and encouraged her to continue to use incentive spirometer 10x per hour and to get up to ue the restroom and ambulate with nursing.  PT Plan: Discharge plan remains appropriate Equipment Recommended: 3 in 1 bedside comode PT Goals  Acute Rehab PT Goals PT Goal: Supine/Side to Sit - Progress: Progressing toward goal PT Goal: Sit to Stand - Progress: Met PT Goal: Ambulate - Progress: Met PT Goal: Up/Down Stairs - Progress: Met  PT Treatment Precautions/Restrictions  Precautions Precautions: Other (comment) (Left chest tube) Required Braces or Orthoses: Yes (Hinged knee brace on right) Other Brace/Splint: Sling left UE Restrictions Weight Bearing Restrictions: No LUE Weight Bearing: Weight bearing as tolerated Mobility (including Balance) Bed Mobility Bed Mobility: Yes Rolling Right: With rail;6: Modified independent (Device/Increase time) Right Sidelying to Sit: With rails;HOB flat;6: Modified independent (Device/Increase time) Supine to Sit: 5: Supervision;HOB elevated (Comment degrees);With rails (50) Sitting - Scoot to Edge of Bed: 5: Supervision Sit to Supine - Right: 5: Supervision;HOB flat Sit to Supine - Right Details (indicate cue type and reason): Cues for proper technique to minimize back pain.  Transfers Transfers: Yes Sit to Stand: 7: Independent Stand to  Sit: 7: Independent Ambulation/Gait Ambulation/Gait: Yes Ambulation/Gait Assistance: 5: Supervision Ambulation/Gait Assistance Details (indicate cue type and reason): Supervision for safety.  Ambulation Distance (Feet): 225 Feet (Required standing rest x 30 sec after 100 feet and 5 stairs) Assistive device: None Gait Pattern: Step-through pattern;Decreased stance time - right Stairs: Yes Stairs Assistance: 5: Supervision Stair Management Technique: Alternating pattern Number of Stairs: 5  Height of Stairs: 8     Exercise    End of Session PT - End of Session Activity Tolerance: Patient tolerated treatment well Patient left: in chair;with call bell in reach Nurse Communication: Mobility status for transfers;Mobility status for ambulation General Behavior During Session: [facility] for tasks performed Cognition: [facility] for tasks performed  Laney Pastor, SPT  06/22/2011, 12:32 PM

## 2011-06-22 NOTE — Progress Notes (Signed)
Occupational Therapy Treatment Patient Details Name: Sarah Wood MRN: [id number] DOB: 06-16-1986 Today's Date: 06/22/2011  OT Assessment/Plan OT Assessment/Plan Comments on Treatment Session: Pt feeling much better today and eager to participate in session.  Pt progressing towards goals. OT Plan: Discharge plan remains appropriate OT Frequency: Min 2X/week Follow Up Recommendations: Home health OT Equipment Recommended: 3 in 1 bedside comode OT Goals Acute Rehab OT Goals OT Goal Formulation: With patient Time For Goal Achievement: 2 weeks ADL Goals Pt Will Perform Grooming: with set-up;with supervision;Standing at sink ADL Goal: Grooming - Progress: Not addressed Pt Will Perform Upper Body Bathing: with set-up;Sitting, chair;with supervision ADL Goal: Upper Body Bathing - Progress: Progressing toward goals Pt Will Perform Upper Body Dressing: with set-up;with supervision;Sitting, chair ADL Goal: Upper Body Dressing - Progress: Progressing toward goals Pt Will Transfer to Toilet: with min assist;Stand pivot transfer;3-in-1 ADL Goal: Toilet Transfer - Progress: Met Pt Will Perform Toileting - Hygiene: with set-up;Sit to stand from 3-in-1/toilet ADL Goal: Toileting - Hygiene - Progress: Met  OT Treatment Precautions/Restrictions  Restrictions Weight Bearing Restrictions: No LUE Weight Bearing: Weight bearing as tolerated   ADL ADL Upper Body Bathing: Performed;Minimal assistance;Chest;Right arm;Left arm;Abdomen Where Assessed - Upper Body Bathing: Sitting, bed;Unsupported Lower Body Bathing: Performed;Minimal assistance Where Assessed - Lower Body Bathing: Sit to stand from bed;Unsupported Upper Body Dressing: Performed;Set up Where Assessed - Upper Body Dressing: Sitting, bed;Unsupported Lower Body Dressing: Minimal assistance;Simulated Where Assessed - Lower Body Dressing: Sit to stand from bed;Supported Toilet Transfer: Simulated;Supervision/safety Toilet Transfer  Method: Stand pivot Mobility  Bed Mobility Bed Mobility: Yes Supine to Sit: 5: Supervision;HOB elevated (Comment degrees);With rails (50) Sitting - Scoot to Edge of Bed: 5: Supervision Transfers Transfers: Yes Sit to Stand: 5: Supervision;From bed Stand to Sit: 5: Supervision;With upper extremity assist;To chair/3-in-1 Exercises    End of Session OT - End of Session Equipment Utilized During Treatment: Gait belt;Right knee immobilizer Activity Tolerance: Patient limited by pain Patient left: in chair;with call bell in reach;with family/visitor present General Behavior During Session: [facility] for tasks performed Cognition: [facility] for tasks performed  Cipriano Mile  06/22/2011, 12:03 PM 06/22/2011 Cipriano Mile OTR/L Pager [phone] Office [phone]

## 2011-06-22 NOTE — Progress Notes (Signed)
Ct suction down to 20cm - hopefully PTX remains unchanged Patient requests to have ACL repaired during this hospitalization.  Doubt this is feasible but will check with ortho. Patient examined and I agree with the assessment and plan  Graham Hyun E

## 2011-06-23 ENCOUNTER — Encounter (HOSPITAL_COMMUNITY): Payer: Self-pay | Admitting: Orthopedic Surgery

## 2011-06-23 ENCOUNTER — Inpatient Hospital Stay (HOSPITAL_COMMUNITY): Payer: No Typology Code available for payment source

## 2011-06-23 MED ORDER — LIDOCAINE HCL (PF) 1 % IJ SOLN
INTRAMUSCULAR | Status: AC
  Administered 2011-06-23: 3 mL
  Filled 2011-06-23: qty 5

## 2011-06-23 MED ORDER — LIDOCAINE-PRILOCAINE 2.5-2.5 % EX CREA
TOPICAL_CREAM | Freq: Once | CUTANEOUS | Status: AC
  Administered 2011-06-23: 13:00:00 via TOPICAL
  Filled 2011-06-23 (×2): qty 5

## 2011-06-23 NOTE — Progress Notes (Signed)
Occupational Therapy Treatment Patient Details Name: Sarah Wood MRN: [id number] DOB: Sep 29, 1985 Today's Date: 06/23/2011  OT Assessment/Plan OT Assessment/Plan Comments on Treatment Session: Pt doing very well today.  Pt with little discomfort and eager to participate in therapy. Pt has met all goals and no longer needs acute OT. OT Plan: Discharge plan remains appropriate OT Frequency: Min 2X/week Follow Up Recommendations: Home health OT Equipment Recommended: 3 in 1 bedside comode OT Goals Acute Rehab OT Goals OT Goal Formulation: With patient Time For Goal Achievement: 2 weeks ADL Goals Pt Will Perform Grooming: with set-up;with supervision;Standing at sink ADL Goal: Grooming - Progress: Met Pt Will Perform Upper Body Bathing: with set-up;Sitting, chair;with supervision ADL Goal: Upper Body Bathing - Progress: Met Pt Will Perform Upper Body Dressing: with set-up;with supervision;Sitting, chair ADL Goal: Upper Body Dressing - Progress: Met Pt Will Transfer to Toilet: with min assist;Stand pivot transfer;3-in-1 ADL Goal: Toilet Transfer - Progress: Met Pt Will Perform Toileting - Hygiene: with set-up;Sit to stand from 3-in-1/toilet ADL Goal: Toileting - Hygiene - Progress: Met  OT Treatment Precautions/Restrictions  Precautions Precautions: Other (comment) (Chest tube) Required Braces or Orthoses: Yes Other Brace/Splint: sling to left UE for comfort Restrictions Weight Bearing Restrictions: No LUE Weight Bearing: Weight bearing as tolerated   ADL ADL Upper Body Dressing: Performed;Modified independent Upper Body Dressing Details (indicate cue type and reason): Pt donned shirt with mod I Where Assessed - Upper Body Dressing: Standing;Unsupported Lower Body Dressing: Performed;Modified independent Where Assessed - Lower Body Dressing: Sit to stand from bed;Unsupported Toilet Transfer: Simulated;Supervision/safety Toilet Transfer Method: Stand pivot Toileting -  Clothing Manipulation: Simulated;Independent Where Assessed - Toileting Clothing Manipulation: Standing Mobility  Bed Mobility Bed Mobility: Yes Supine to Sit: 6: Modified independent (Device/Increase time);HOB elevated (Comment degrees) (50) Sitting - Scoot to Edge of Bed: 6: Modified independent (Device/Increase time) Sit to Supine - Right: 6: Modified independent (Device/Increase time);HOB elevated (comment degrees) (50) Transfers Transfers: Yes Sit to Stand: 7: Independent Stand to Sit: 7: Independent Exercises    End of Session OT - End of Session Equipment Utilized During Treatment: Right knee immobilizer Activity Tolerance: Patient tolerated treatment well Patient left: in bed;with call bell in reach;with family/visitor present General Behavior During Session: [facility] for tasks performed Cognition: [facility] for tasks performed  Cipriano Mile  06/23/2011, 11:58 AM 06/23/2011 Cipriano Mile OTR/L Pager [phone] Office [phone]
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
## 2011-06-23 NOTE — Progress Notes (Signed)
Plan regarding CT discussed with patient and husband at length Patient examined and I agree with the assessment and plan  Sarah Wood E
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
## 2011-06-23 NOTE — Progress Notes (Signed)
Patient ID: Sarah Wood, female   DOB: 1986-04-16, 25 y.o.   MRN: [id number]  Procedure note:  Dx: Foreign bodies right wrist Procedure: Excision superficial foreign bodies right wrist area x 3 Anesthesia: Lidocaine 1% x 3 cc  The patient's right wrist was sterile prepped and draped and Xylocaine was injected in 3 separate areas of the right wrist over several small lacerations, all less than 0.5 cm each. The wounds were then probed and several small pieces of glass and debris were removed from each wound. The wounds were covered with 4x4 and Kelix,.  The pt tolerated the procedure well.   Jacques Fife,PA-C Pager [phone] General Trauma Pager [phone]

## 2011-06-23 NOTE — Progress Notes (Signed)
OT DISCHARGE NOTE Patient is being discharged from PT/ OT/ SLP services secondary to:  Goals met and no further therapy needs identified. Pt agrees with d/c plan.   06/23/2011 Cipriano Mile OTR/L Pager [phone] Office [phone]

## 2011-06-23 NOTE — Progress Notes (Signed)
Sarah Wood E  
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
## 2011-06-24 ENCOUNTER — Inpatient Hospital Stay (HOSPITAL_COMMUNITY): Payer: No Typology Code available for payment source

## 2011-06-24 MED ORDER — FENTANYL CITRATE 0.05 MG/ML IJ SOLN: 50.0000 ug | INTRAMUSCULAR | Status: DC | PRN

## 2011-06-24 MED ORDER — MIDAZOLAM HCL 2 MG/2ML IJ SOLN: 1.0000 mg | INTRAMUSCULAR | Status: DC | PRN

## 2011-06-24 NOTE — Addendum Note (Signed)
Addendum  created 06/24/11 0820 by Bedelia Person, MD   Modules edited:Charting, Inpatient Notes
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
## 2011-06-24 NOTE — Progress Notes (Signed)
No air leak, minimal fluctuation in the tube with deep breaths.    Will likely remove the tube 2 days or son after surgery.  This patient has been seen and I agree with the findings and treatment plan.  Marta Lamas. Gae Bon, MD, FACS [phone] (pager) [phone] (direct pager) Trauma Surgeon

## 2011-06-24 NOTE — Progress Notes (Signed)
Physical Therapy Treatment Patient Details Name: Sarah [name] MRN: [id number] DOB: 14-Mar-1986 Today's Date: 06/24/2011  PT Assessment/Plan  PT - Assessment/Plan Comments on Treatment Session: Pt making great progression. Pt did desaturate to SpO2 = 92% with ambulation however increased to 95-96% with cues for deep breathing. Pt with c/o soreness of Lt. middle/lower trapezius which was decreased with some soft tissue mobilization. Pt to have ACL surgery tomorrow. Will need to know weight bearing status of Rt.LE post surgery as well as any precautions with use of Lt. UE with use of RW as RW use anticipated.  PT Plan: Discharge plan remains appropriate PT Frequency: Min 4X/week Follow Up Recommendations: Home health PT;24 hour supervision/assistance Equipment Recommended: 3 in 1 bedside comode PT Goals  Acute Rehab PT Goals PT Goal Formulation:  (will re-set goals post surgery) PT Goal: Supine/Side to Sit - Progress: Progressing toward goal  PT Treatment Precautions/Restrictions  Precautions Precautions: Other (comment) (Chest tube) Required Braces or Orthoses: Yes Other Brace/Splint: sling to left UE for comfort Restrictions Weight Bearing Restrictions: No LUE Weight Bearing: Weight bearing as tolerated Mobility (including Balance) Bed Mobility Bed Mobility: Yes Supine to Sit: 6: Modified independent (Device/Increase time);HOB elevated (Comment degrees) (50) Sitting - Scoot to Edge of Bed: 6: Modified independent (Device/Increase time) Sit to Supine - Right:  (45) Transfers Transfers: Yes Sit to Stand: 7: Independent Stand to Sit: 7: Independent Ambulation/Gait Ambulation/Gait: Yes Ambulation/Gait Assistance: 4: Min assist Ambulation/Gait Assistance Details (indicate cue type and reason): Assistance with dynamic challenges, otherwise supervision Ambulation Distance (Feet): 400 Feet Assistive device: None Gait Pattern: Decreased stance time - right;Step-through  pattern Stairs: No Stair Management Technique: Seated/boosting  Balance Balance Assessed: Yes High Level Balance High Level Balance Activites: Turns;Head turns High Level Balance Comments: Pt requires min assist at times as she side steps with head turns.  End of Session PT - End of Session Equipment Utilized During Treatment:  (L. UE sling) Activity Tolerance: Patient tolerated treatment well Patient left: in chair;with call bell in reach Nurse Communication: Mobility status for transfers;Mobility status for ambulation General Behavior During Session: [facility] for tasks performed Cognition: [facility] for tasks performed Cognitive Impairment: Some decreased processing at times, otherwise [facility]. High level cognitive processess not assessed  Sherie Don 06/24/2011, 2:15 PM  Sherie Don) Carleene Mains PT, DPT Acute Rehabilitation [phone]

## 2011-06-24 NOTE — Anesthesia Postprocedure Evaluation (Signed)
  Anesthesia Post-op Note  Patient: Sarah [name]  Procedure(s) Performed:  OPEN REDUCTION INTERNAL FIXATION (ORIF) CLAVICULAR FRACTURE; ARTHROCENTESIS ASPIRATION LARGE JOINT  Patient Location: PACU  Anesthesia Type: General  Level of Consciousness: awake and alert    Airway and Oxygen Therapy: Patient Spontanous Breathing  Post-op Pain: mild  Post-op Assessment: Post-op Vital signs reviewed, Patient's Cardiovascular Status Stable and Respiratory Function Stable  Post-op Vital Signs: stable  Complications: No apparent anesthesia complications

## 2011-06-25 ENCOUNTER — Encounter (HOSPITAL_COMMUNITY): Payer: Self-pay | Admitting: Certified Registered"

## 2011-06-25 ENCOUNTER — Inpatient Hospital Stay (HOSPITAL_COMMUNITY): Payer: No Typology Code available for payment source

## 2011-06-25 ENCOUNTER — Encounter (HOSPITAL_COMMUNITY): Admission: EM | Disposition: A | Discharge status: Home / Self Care | Payer: Self-pay | Source: Home / Self Care

## 2011-06-25 ENCOUNTER — Inpatient Hospital Stay (HOSPITAL_COMMUNITY): Payer: No Typology Code available for payment source | Admitting: Certified Registered"

## 2011-06-25 HISTORY — PX: ANTERIOR CRUCIATE LIGAMENT REPAIR: SHX115

## 2011-06-25 SURGERY — RECONSTRUCTION, KNEE, ACL, USING HAMSTRING GRAFT
Anesthesia: Choice | Site: Knee | Laterality: Right | Wound class: Clean

## 2011-06-25 MED ORDER — LORAZEPAM 2 MG/ML IJ SOLN: 1.0000 mg | Freq: Once | INTRAMUSCULAR | Status: DC | PRN

## 2011-06-25 MED ORDER — METOCLOPRAMIDE HCL 10 MG PO TABS: 5.0000 mg | ORAL_TABLET | Freq: Three times a day (TID) | ORAL | Status: DC | PRN

## 2011-06-25 MED ORDER — MIDAZOLAM HCL 5 MG/ML IJ SOLN
INTRAMUSCULAR | Status: DC | PRN
  Administered 2011-06-25: 2 mg via INTRAVENOUS

## 2011-06-25 MED ORDER — BUPIVACAINE HCL (PF) 0.25 % IJ SOLN
INTRAMUSCULAR | Status: DC | PRN
  Administered 2011-06-25: 10 mL via INTRA_ARTICULAR

## 2011-06-25 MED ORDER — METOCLOPRAMIDE HCL 5 MG/ML IJ SOLN
5.0000 mg | Freq: Three times a day (TID) | INTRAMUSCULAR | Status: DC | PRN
  Filled 2011-06-25: qty 2

## 2011-06-25 MED ORDER — PROMETHAZINE HCL 25 MG/ML IJ SOLN: 6.2500 mg | INTRAMUSCULAR | Status: DC | PRN

## 2011-06-25 MED ORDER — SODIUM CHLORIDE 0.9 % IR SOLN
Status: DC | PRN
  Administered 2011-06-25: 3000 mL

## 2011-06-25 MED ORDER — CLONIDINE HCL (ANALGESIA) 100 MCG/ML EP SOLN
150.0000 ug | Freq: Once | EPIDURAL | Status: DC
  Filled 2011-06-25: qty 1.5

## 2011-06-25 MED ORDER — LORAZEPAM 2 MG/ML IJ SOLN: 1.0000 mg | Freq: Once | INTRAMUSCULAR | Status: AC | PRN

## 2011-06-25 MED ORDER — LACTATED RINGERS IV SOLN
INTRAVENOUS | Status: DC | PRN
  Administered 2011-06-25 (×2): via INTRAVENOUS

## 2011-06-25 MED ORDER — HYDROMORPHONE HCL PF 1 MG/ML IJ SOLN
0.2500 mg | INTRAMUSCULAR | Status: DC | PRN
  Administered 2011-06-25 (×5): 0.5 mg via INTRAVENOUS
  Filled 2011-06-25 (×2): qty 1

## 2011-06-25 MED ORDER — ONDANSETRON HCL 4 MG/2ML IJ SOLN
INTRAMUSCULAR | Status: DC | PRN
  Administered 2011-06-25: 4 mg via INTRAVENOUS

## 2011-06-25 MED ORDER — GLYCOPYRROLATE 0.2 MG/ML IJ SOLN
INTRAMUSCULAR | Status: DC | PRN
  Administered 2011-06-25: 0.3 mg via INTRAVENOUS

## 2011-06-25 MED ORDER — PROPOFOL 10 MG/ML IV EMUL
INTRAVENOUS | Status: DC | PRN
  Administered 2011-06-25: 200 mg via INTRAVENOUS

## 2011-06-25 MED ORDER — NEOSTIGMINE METHYLSULFATE 1 MG/ML IJ SOLN
INTRAMUSCULAR | Status: DC | PRN
  Administered 2011-06-25: 4 mg via INTRAVENOUS

## 2011-06-25 MED ORDER — ONDANSETRON HCL 4 MG PO TABS: 4.0000 mg | ORAL_TABLET | Freq: Four times a day (QID) | ORAL | Status: DC | PRN

## 2011-06-25 MED ORDER — CLONIDINE HCL (ANALGESIA) 100 MCG/ML EP SOLN
EPIDURAL | Status: DC | PRN
  Administered 2011-06-25: .6 mL via INTRA_ARTICULAR

## 2011-06-25 MED ORDER — FENTANYL CITRATE 0.05 MG/ML IJ SOLN
INTRAMUSCULAR | Status: DC | PRN
  Administered 2011-06-25: 50 ug via INTRAVENOUS
  Administered 2011-06-25: 100 ug via INTRAVENOUS
  Administered 2011-06-25 (×2): 50 ug via INTRAVENOUS
  Administered 2011-06-25: 100 ug via INTRAVENOUS

## 2011-06-25 MED ORDER — POTASSIUM CHLORIDE IN NACL 20-0.9 MEQ/L-% IV SOLN
INTRAVENOUS | Status: DC
  Filled 2011-06-25 (×3): qty 1000

## 2011-06-25 MED ORDER — MORPHINE SULFATE 4 MG/ML IJ SOLN
INTRAMUSCULAR | Status: DC | PRN
  Administered 2011-06-25: 4 mg via INTRAVENOUS

## 2011-06-25 MED ORDER — HYDROMORPHONE HCL PF 1 MG/ML IJ SOLN: 0.2500 mg | INTRAMUSCULAR | Status: DC | PRN

## 2011-06-25 MED ORDER — LORAZEPAM 2 MG/ML IJ SOLN
1.0000 mg | Freq: Once | INTRAMUSCULAR | Status: AC | PRN
  Administered 2011-06-25: 1 mg via INTRAVENOUS

## 2011-06-25 MED ORDER — ONDANSETRON HCL 4 MG/2ML IJ SOLN: 4.0000 mg | Freq: Four times a day (QID) | INTRAMUSCULAR | Status: DC | PRN

## 2011-06-25 MED ORDER — VANCOMYCIN HCL 1000 MG IV SOLR
1000.0000 mg | INTRAVENOUS | Status: DC | PRN
  Administered 2011-06-25: 1000 mg via INTRAVENOUS

## 2011-06-25 MED ORDER — VANCOMYCIN HCL IN DEXTROSE 1-5 GM/200ML-% IV SOLN
INTRAVENOUS | Status: AC
  Filled 2011-06-25: qty 200

## 2011-06-25 MED ORDER — ROCURONIUM BROMIDE 100 MG/10ML IV SOLN
INTRAVENOUS | Status: DC | PRN
  Administered 2011-06-25 (×2): 10 mg via INTRAVENOUS
  Administered 2011-06-25: 40 mg via INTRAVENOUS

## 2011-06-25 MED ORDER — VANCOMYCIN HCL IN DEXTROSE 1-5 GM/200ML-% IV SOLN
1000.0000 mg | Freq: Two times a day (BID) | INTRAVENOUS | Status: AC
  Administered 2011-06-26: 1000 mg via INTRAVENOUS
  Filled 2011-06-25: qty 200

## 2011-06-25 SURGICAL SUPPLY — 83 items
ACL TIGHT ROPE ×2 IMPLANT
ANCHOR BUTTON TIGHTROPE ACL RT (Orthopedic Implant) ×2 IMPLANT
BANDAGE ELASTIC 6 VELCRO ST LF (GAUZE/BANDAGES/DRESSINGS) ×2 IMPLANT
BANDAGE ESMARK 6X9 LF (GAUZE/BANDAGES/DRESSINGS) ×1 IMPLANT
BENZOIN TINCTURE PRP APPL 2/3 (GAUZE/BANDAGES/DRESSINGS) ×2 IMPLANT
BLADE CUDA 5.5 (BLADE) ×2 IMPLANT
BLADE GREAT WHITE 4.2 (BLADE) ×2 IMPLANT
BLADE SURG 10 STRL SS (BLADE) ×2 IMPLANT
BLADE SURG 15 STRL LF DISP TIS (BLADE) ×2 IMPLANT
BLADE SURG 15 STRL SS (BLADE) ×2
BNDG ELASTIC 6X15 VLCR STRL LF (GAUZE/BANDAGES/DRESSINGS) ×2 IMPLANT
BNDG ESMARK 6X9 LF (GAUZE/BANDAGES/DRESSINGS) ×2
BUR OVAL 6.0 (BURR) ×2 IMPLANT
CLOSURE STERI STRIP 1/2 X4 (GAUZE/BANDAGES/DRESSINGS) ×2 IMPLANT
CLOTH BEACON ORANGE TIMEOUT ST (SAFETY) ×2 IMPLANT
COVER SURGICAL LIGHT HANDLE (MISCELLANEOUS) ×2 IMPLANT
CUFF TOURNIQUET SINGLE 34IN LL (TOURNIQUET CUFF) IMPLANT
CUFF TOURNIQUET SINGLE 44IN (TOURNIQUET CUFF) IMPLANT
DECANTER SPIKE VIAL GLASS SM (MISCELLANEOUS) ×2 IMPLANT
DRAPE ARTHROSCOPY W/POUCH 114 (DRAPES) ×2 IMPLANT
DRAPE INCISE IOBAN 66X45 STRL (DRAPES) ×2 IMPLANT
DRAPE U-SHAPE 47X51 STRL (DRAPES) ×2 IMPLANT
DRSG PAD ABDOMINAL 8X10 ST (GAUZE/BANDAGES/DRESSINGS) ×2 IMPLANT
ELECT REM PT RETURN 9FT ADLT (ELECTROSURGICAL) ×2
ELECTRODE REM PT RTRN 9FT ADLT (ELECTROSURGICAL) ×1 IMPLANT
EVACUATOR 1/8 PVC DRAIN (DRAIN) IMPLANT
FLIP CUTTER 8 MM ×2 IMPLANT
GAUZE SPONGE 4X4 12PLY STRL LF (GAUZE/BANDAGES/DRESSINGS) ×2 IMPLANT
GAUZE XEROFORM 1X8 LF (GAUZE/BANDAGES/DRESSINGS) ×2 IMPLANT
GLOVE BIO SURGEON ST LM GN SZ9 (GLOVE) ×2 IMPLANT
GLOVE BIOGEL PI IND STRL 8 (GLOVE) ×1 IMPLANT
GLOVE BIOGEL PI IND STRL 9 (GLOVE) ×1 IMPLANT
GLOVE BIOGEL PI INDICATOR 8 (GLOVE) ×1
GLOVE BIOGEL PI INDICATOR 9 (GLOVE) ×1
GLOVE SURG ORTHO 8.0 STRL STRW (GLOVE) ×2 IMPLANT
GOWN PREVENTION PLUS LG XLONG (DISPOSABLE) IMPLANT
GOWN PREVENTION PLUS XLARGE (GOWN DISPOSABLE) ×2 IMPLANT
GOWN STRL NON-REIN LRG LVL3 (GOWN DISPOSABLE) ×6 IMPLANT
IMMOBILIZER KNEE 22 UNIV (SOFTGOODS) ×2 IMPLANT
KIT BASIN OR (CUSTOM PROCEDURE TRAY) ×2 IMPLANT
KIT ROOM TURNOVER OR (KITS) ×2 IMPLANT
KIT TRANSTIBIAL (DISPOSABLE) IMPLANT
MANIFOLD NEPTUNE II (INSTRUMENTS) ×2 IMPLANT
NEEDLE 18GX1X1/2 (RX/OR ONLY) (NEEDLE) ×2 IMPLANT
NS IRRIG 1000ML POUR BTL (IV SOLUTION) ×2 IMPLANT
PACK ARTHROSCOPY DSU (CUSTOM PROCEDURE TRAY) ×2 IMPLANT
PAD ARMBOARD 7.5X6 YLW CONV (MISCELLANEOUS) ×4 IMPLANT
PAD CAST 4YDX4 CTTN HI CHSV (CAST SUPPLIES) ×1 IMPLANT
PADDING CAST COTTON 4X4 STRL (CAST SUPPLIES) ×1
PADDING CAST COTTON 6X4 STRL (CAST SUPPLIES) ×6 IMPLANT
PADDING WEBRIL 6 STERILE (GAUZE/BANDAGES/DRESSINGS) ×2 IMPLANT
PASSER SUT SWANSON 36MM LOOP (INSTRUMENTS) ×2 IMPLANT
PENCIL BUTTON HOLSTER BLD 10FT (ELECTRODE) ×2 IMPLANT
REAMER C 10MM (INSTRUMENTS) IMPLANT
SCREW LO PRO 25MM (Screw) ×2 IMPLANT
SET ARTHROSCOPY TUBING (MISCELLANEOUS) ×1
SET ARTHROSCOPY TUBING LN (MISCELLANEOUS) ×1 IMPLANT
SPONGE GAUZE 4X4 12PLY (GAUZE/BANDAGES/DRESSINGS) ×4 IMPLANT
SPONGE LAP 4X18 X RAY DECT (DISPOSABLE) ×4 IMPLANT
SPONGE SCRUB IODOPHOR (GAUZE/BANDAGES/DRESSINGS) ×2 IMPLANT
SUCTION FRAZIER TIP 10 FR DISP (SUCTIONS) ×2 IMPLANT
SUT 2 FIBERLOOP 20 STRT BLUE (SUTURE) ×4
SUT ETHILON 3 0 PS 1 (SUTURE) ×2 IMPLANT
SUT FIBERWIRE #2 38 T-5 BLUE (SUTURE)
SUT MENISCAL KIT (KITS) IMPLANT
SUT PROLENE 3 0 PS 2 (SUTURE) ×2 IMPLANT
SUT VIC AB 0 CT1 27 (SUTURE) ×1
SUT VIC AB 0 CT1 27XBRD ANBCTR (SUTURE) ×1 IMPLANT
SUT VIC AB 2-0 CT1 27 (SUTURE) ×1
SUT VIC AB 2-0 CT1 TAPERPNT 27 (SUTURE) ×1 IMPLANT
SUT VICRYL 0 TIES 12 18 (SUTURE) ×2 IMPLANT
SUTURE 2 FIBERLOOP 20 STRT BLU (SUTURE) ×2 IMPLANT
SUTURE FIBERWR #2 38 T-5 BLUE (SUTURE) IMPLANT
SYR 30ML LL (SYRINGE) ×2 IMPLANT
SYR 30ML SLIP (SYRINGE) ×2 IMPLANT
SYR BULB IRRIGATION 50ML (SYRINGE) ×2 IMPLANT
SYR TB 1ML LUER SLIP (SYRINGE) ×2 IMPLANT
TOWEL OR 17X24 6PK STRL BLUE (TOWEL DISPOSABLE) ×2 IMPLANT
TOWEL OR 17X26 10 PK STRL BLUE (TOWEL DISPOSABLE) ×2 IMPLANT
UNDERPAD 30X30 INCONTINENT (UNDERPADS AND DIAPERS) ×2 IMPLANT
WAND 90 DEG TURBOVAC W/CORD (SURGICAL WAND) ×2 IMPLANT
WATER STERILE IRR 1000ML POUR (IV SOLUTION) ×2 IMPLANT
WRAP KNEE MAXI GEL POST OP (GAUZE/BANDAGES/DRESSINGS) IMPLANT

## 2011-06-25 NOTE — Progress Notes (Signed)
Report given to Benna Dunks, RN at 5000.

## 2011-06-25 NOTE — Progress Notes (Signed)
Pt stable CT in place on sxn Plan acl recon today All questions answered today and yesterday about procedure and recovery

## 2011-06-25 NOTE — Progress Notes (Signed)
This patient has been seen and I agree with the findings and treatment plan.  Amelia Burgard O. Eason Housman, III, MD, FACS [phone] (pager) [phone] (direct pager) Trauma Surgeon  

## 2011-06-25 NOTE — Anesthesia Procedure Notes (Signed)
Procedure Name: Intubation Date/Time: 06/25/2011 1:20 PM Performed by: Ellin Goodie Pre-anesthesia Checklist: Patient identified, Emergency Drugs available, Suction available, Patient being monitored and Timeout performed Patient Re-evaluated:Patient Re-evaluated prior to inductionOxygen Delivery Method: Circle System Utilized Preoxygenation: Pre-oxygenation with 100% oxygen Intubation Type: Inhalational induction with existing ETT Ventilation: Mask ventilation without difficulty Laryngoscope Size: Mac and 3 Grade View: Grade I Tube type: Oral Tube size: 7.5 mm Airway Equipment and Method: stylet Placement Confirmation: ETT inserted through vocal cords under direct vision Secured at: 22 cm Tube secured with: Tape Dental Injury: Teeth and Oropharynx as per pre-operative assessment

## 2011-06-25 NOTE — Brief Op Note (Signed)
06/16/2011 - 06/25/2011  3:18 PM  PATIENT:  Sarah Wood  25 y.o. female  PRE-OPERATIVE DIAGNOSIS:  Right Knee ACL tear  POST-OPERATIVE DIAGNOSIS:  Right Knee ACL tear  PROCEDURE:  Procedure(s): RECONSTRUCTION ANTERIOR CRUCIATE LIGAMENT (ACL) WITH HAMSTRING GRAFT  SURGEON:  Surgeon(s): Corrie Mckusick Lanah Steines  ASSISTANT:S Vernon PA*  ANESTHESIA:   general  EBL: 10 ml    Total I/O In: 1000 [I.V.:1000] Out: -   BLOOD ADMINISTERED: none  DRAINS: none   LOCAL MEDICATIONS USED:  Morphine / marcaine / clonidine injected into knee - T.T. 66 min @ 300 mm hg  SPECIMEN:  No Specimen  COUNTS:  YES  TOURNIQUET:  * Missing tourniquet times found for documented tourniquets in log:  13932 *  DICTATION: .Other Dictation: Dictation Number [id number]  PLAN OF CARE: Admit to inpatient   PATIENT DISPOSITION:  PACU - hemodynamically stable
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
## 2011-06-25 NOTE — Transfer of Care (Signed)
Immediate Anesthesia Transfer of Care Note  Patient: Sarah Wood  Procedure(s) Performed:  RECONSTRUCTION ANTERIOR CRUCIATE LIGAMENT (ACL) WITH HAMSTRING GRAFT - Right Knee Anterior Cructiate Ligament Reconstruction with Hamstring Autograft  Patient Location: PACU  Anesthesia Type: General  Level of Consciousness: alert   Airway & Oxygen Therapy: Patient Spontanous Breathing  Post-op Assessment: Report given to PACU RN  Post vital signs: stable  Complications: No apparent anesthesia complications

## 2011-06-25 NOTE — Anesthesia Preprocedure Evaluation (Addendum)
Anesthesia Evaluation  Patient identified by MRN, date of birth, ID band Patient awake    Reviewed: Allergy & Precautions, H&P , NPO status , Patient's Chart, lab work & pertinent test results  Airway Mallampati: I      Dental   Pulmonary  + rhonchi        Cardiovascular Normal    Neuro/Psych    GI/Hepatic   Endo/Other    Renal/GU      Musculoskeletal   Abdominal   Peds  Hematology   Anesthesia Other Findings Multiple rib fxs, clavicle fx  Reproductive/Obstetrics                           Anesthesia Physical Anesthesia Plan  ASA: II  Anesthesia Plan: General   Post-op Pain Management:    Induction: Intravenous  Airway Management Planned: Oral ETT  Additional Equipment:   Intra-op Plan:   Post-operative Plan: Extubation in OR  Informed Consent: I have reviewed the patients History and Physical, chart, labs and discussed the procedure including the risks, benefits and alternatives for the proposed anesthesia with the patient or authorized representative who has indicated his/her understanding and acceptance.     Plan Discussed with: CRNA and Surgeon  Anesthesia Plan Comments:         Anesthesia Quick Evaluation

## 2011-06-25 NOTE — Anesthesia Postprocedure Evaluation (Signed)
  Anesthesia Post-op Note  Patient: Sarah Wood  Procedure(s) Performed:  RECONSTRUCTION ANTERIOR CRUCIATE LIGAMENT (ACL) WITH HAMSTRING GRAFT - Right Knee Anterior Cructiate Ligament Reconstruction with Hamstring Autograft  Patient Location: PACU  Anesthesia Type: General  Level of Consciousness: awake, alert  and oriented  Airway and Oxygen Therapy: Patient Spontanous Breathing  Post-op Pain: mild  Post-op Assessment: Post-op Vital signs reviewed, Patient's Cardiovascular Status Stable, Respiratory Function Stable, Patent Airway, No signs of Nausea or vomiting and Pain level controlled  Post-op Vital Signs: stable  Complications: No apparent anesthesia complications

## 2011-06-26 ENCOUNTER — Encounter (HOSPITAL_COMMUNITY): Payer: Self-pay | Admitting: Orthopedic Surgery

## 2011-06-26 ENCOUNTER — Inpatient Hospital Stay (HOSPITAL_COMMUNITY): Payer: No Typology Code available for payment source

## 2011-06-26 MED ORDER — MORPHINE SULFATE 4 MG/ML IJ SOLN
4.0000 mg | Freq: Once | INTRAMUSCULAR | Status: AC
  Administered 2011-06-26: 4 mg via INTRAVENOUS

## 2011-06-26 MED ORDER — OXYCODONE HCL 5 MG PO TABS
10.0000 mg | ORAL_TABLET | ORAL | Status: DC | PRN
  Administered 2011-06-26 (×2): 20 mg via ORAL
  Filled 2011-06-26 (×2): qty 4

## 2011-06-26 MED ORDER — POLYETHYLENE GLYCOL 3350 17 G PO PACK: 17.0000 g | PACK | Freq: Every day | ORAL | Status: AC

## 2011-06-26 MED ORDER — OXYCODONE-ACETAMINOPHEN 10-325 MG PO TABS: 1.0000 | ORAL_TABLET | ORAL | Status: AC | PRN

## 2011-06-26 MED ORDER — MORPHINE SULFATE CR 15 MG PO TB12: ORAL_TABLET | ORAL | Status: DC

## 2011-06-26 MED ORDER — TRAMADOL HCL 50 MG PO TABS: 100.0000 mg | ORAL_TABLET | Freq: Four times a day (QID) | ORAL | Status: AC

## 2011-06-26 MED ORDER — DSS 100 MG PO CAPS: 100.0000 mg | ORAL_CAPSULE | Freq: Two times a day (BID) | ORAL | Status: AC

## 2011-06-26 MED ORDER — HYDROMORPHONE HCL 2 MG PO TABS: 4.0000 mg | ORAL_TABLET | ORAL | Status: DC | PRN

## 2011-06-26 MED ORDER — METHOCARBAMOL 500 MG PO TABS: 1000.0000 mg | ORAL_TABLET | Freq: Four times a day (QID) | ORAL | Status: AC | PRN

## 2011-06-26 NOTE — Progress Notes (Signed)
Breath sounds are equal after CT removal.  Patient is comfortable.  This patient has been seen and I agree with the findings and treatment plan.  Marta Lamas. Gae Bon, MD, FACS [phone] (pager) [phone] (direct pager) Trauma Surgeon

## 2011-06-26 NOTE — Progress Notes (Signed)
Physical Therapy Treatment Patient Details Name: Sarah Wood MRN: [id number] DOB: 1986/01/02 Today's Date: 06/26/2011  PT Assessment/Plan  PT - Assessment/Plan Comments on Treatment Session: Pt with slight decline in function secondary to surgery on ACL. Pt hesitant to bear weight through RLE even with encouragement and importance for functioning. Continue per plan PT Plan: Discharge plan remains appropriate PT Frequency: Min 4X/week Follow Up Recommendations: Home health PT;24 hour supervision/assistance Equipment Recommended: Rolling walker with 5" wheels;3 in 1 bedside comode PT Goals  Acute Rehab PT Goals PT Goal Formulation: With patient Time For Goal Achievement: 7 days PT Goal: Supine/Side to Sit - Progress: Progressing toward goal PT Goal: Sit to Stand - Progress: Progressing toward goal PT Goal: Ambulate - Progress: Progressing toward goal PT Goal: Up/Down Stairs - Progress: Progressing toward goal  PT Treatment Precautions/Restrictions  Precautions Precautions: Other (comment) (Chest tube) Required Braces or Orthoses: Yes Other Brace/Splint: sling to left UE for comfort Restrictions Weight Bearing Restrictions: No LUE Weight Bearing: Weight bearing as tolerated RLE Weight Bearing: Partial weight bearing RLE Partial Weight Bearing Percentage or Pounds: 50% Mobility (including Balance) Bed Mobility Bed Mobility: Yes Supine to Sit: 5: Supervision Supine to Sit Details (indicate cue type and reason): VC for sequencing and hand placement for safety with RLE Sitting - Scoot to Edge of Bed: 6: Modified independent (Device/Increase time) Sitting - Scoot to Edge of Bed Details (indicate cue type and reason): VC for hand placement. Pt required no physical assist Transfers Transfers: Yes Sit to Stand: From chair/3-in-1;From bed;With upper extremity assist;5: Supervision Sit to Stand Details (indicate cue type and reason): Supervision for stability upon standing. VC for  hand placement for safety from a stable surface Stand to Sit: 5: Supervision;With upper extremity assist;To chair/3-in-1 Stand to Sit Details: VC for hand placement. Pt with controlled descent Ambulation/Gait Ambulation/Gait: Yes Ambulation/Gait Assistance: 4: Min assist (Minguard assist) Ambulation/Gait Assistance Details (indicate cue type and reason): VC for proper gait sequencing to maintain PWB status as well as safety with RW. Ambulation Distance (Feet): 20 Feet (2 trials) Assistive device: Rolling walker Gait Pattern: Step-to pattern;Decreased stance time - right (Hesitant to bear weight through RLE) Gait velocity: Slow cadence Stairs: No    Exercise  Total Joint Exercises Long Arc Quad: AAROM;Strengthening;Right;10 reps Knee Flexion: AAROM;Strengthening;Right;Seated;10 reps;Limitations Knee Flexion Limitations: Knee flexed 40 degrees only secondary to pain End of Session PT - End of Session Equipment Utilized During Treatment: Gait belt Activity Tolerance: Patient tolerated treatment well Patient left: in chair;with call bell in reach Nurse Communication: Mobility status for transfers;Mobility status for ambulation General Behavior During Session: [facility] for tasks performed Cognition: [facility] for tasks performed  Milana Kidney 06/26/2011, 1:46 PM  06/26/2011 Milana Kidney DPT PAGER: [phone] OFFICE: [phone]

## 2011-06-26 NOTE — Discharge Planning (Signed)
Patient discharged home in stable condition. Verbalizes understanding of all discharge instructions, including home medications and follow up appointments. 
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
## 2011-06-26 NOTE — Progress Notes (Signed)
Pt stable - dressing changed - incisions ok CPM today with PT - will need HHC and PT for dc

## 2011-06-26 NOTE — Discharge Summary (Signed)
This patient has been seen and I agree with the findings and treatment plan.  Nasiah Polinsky O. Lavayah Vita, III, MD, FACS [phone] (pager) [phone] (direct pager) Trauma Surgeon  

## 2011-06-26 NOTE — Op Note (Signed)
Sarah Wood, Sarah Wood NO.:  [id number]  MEDICAL RECORD NO.:  [id number]  LOCATION:  5019                         FACILITY:  [facility]  PHYSICIAN:  Burnard Bunting, M.D.    DATE OF BIRTH:  [birth year]  DATE OF PROCEDURE:  06/25/2011 DATE OF DISCHARGE:                              OPERATIVE REPORT   PREOPERATIVE DIAGNOSIS:  Right knee anterior cruciate ligament tear.  POSTOPERATIVE DIAGNOSIS:  Right knee anterior cruciate ligament tear.  PROCEDURE:  Right knee anterior cruciate ligament reconstruction autograft, hamstring.  SURGEON:  Burnard Bunting, MD  ASSISTANT:  Sarah Neighbors, PA-C  ANESTHESIA:  General endotracheal.  ESTIMATED BLOOD LOSS:  20 mL.  TOURNIQUET TIME:  66 minutes at 300 mmHg.  INDICATIONS:  Sarah Wood is a 25 year old patient with right knee ACL tear presents for operative management after explanation of risks and benefits.  PROCEDURE IN DETAIL:  The patient was brought to the operating room, where general endotracheal anesthesia was induced.  Preoperative antibiotics were administered.  Right knee was examined under anesthesia with the following findings:  Extension negative 7 degrees, flexion 145. The patient was stable to varus and valgus stress at 0 and 30 degrees. No posterolateral rotatory instability.  PCL was intact.  ACL demonstrated a positive Lachman, and positive pivot shift.  OPERATIVE FINDINGS:  Diagnostic arthroscopy: 1. Intact patellofemoral compartment. 2. Intact medial compartment articular cartilage and meniscus. 3. Intact lateral compartment articular cartilage and meniscus. 4. Intact PCL and torn ACL.  PROCEDURE IN DETAIL:  The patient was brought to the operating room, where general endotracheal anesthesia was induced.  Preoperative antibiotics were administered.  Time-out was called.  Right leg was prescrubbed with alcohol and Betadine, shattered air dry and prepped with DuraPrep solution and draped in  sterile manner.  Sarah Wood was used to cover the operative field.  Leg was elevated and exsanguinated with an Esmarch wrap.  Tourniquet was inflated.  Hamstring tendons were first harvested gracilis and semitendinosus through a small incision just distal and medial to the tibial tubercle.  Care was taken to avoid injury to the saphenous nerve.  The tendons were harvested.  Sarah Deed, PA prepared the graft on the back table to a length of 100 mm. Concurrent with that, anterior-inferolateral and anterior-inferior medial portals were established.  Diagnostic arthroscopy demonstrated intact patellofemoral compartment, intact medial and lateral compartments, intact meniscus and articular cartilage.  ACL was torn. The stump was debrided.  Notchplasty performed.  Back wall identified. A  FlipCutter was placed through the lateral portal and excellent tunnel in about the 3 o'clock position was made from outside in using the FlipCutter.  In a similar fashion, tibial tunnel was made in the stump of the native ACL using an 8-mm FlipCutter.  An 8-mm  FlipCutter was also used on the femur.  The graft was then passed and secured with an EndoButton on the femur and a post on the tibia.  Good fixation was achieved.  The graft was pre-tensioned before placing it into the femur. The patient had excellent range of motion, graft was tensioned in about 10 degrees of flexion.  At this time, the knee was taken through a range of  motion and found to have no impingement of the graft.  Tourniquet was released.  Bleeding points encountered were controlled using electrocautery.  The knee joint was thoroughly irrigated.  Incisions were all irrigated and closed.  Harvest incision was closed using interrupted and inverted 0 Vicryl suture, 2-0 Vicryl suture, and a running 3-0 Prolene sutures.  Skin incisions were closed using 3-0 nylon.  Solution of Marcaine, morphine and clonidine was injected into the knee.  The  patient tolerated the procedure well without immediate complication.  Bulky wrap was applied.  Sarah Wood's assistance was required at all times during the case for retraction of neurovascular structures, then graft preparation, opening, closing, graft passage. Her assistance was a medical necessity.     Burnard Bunting, M.D.     GSD/MEDQ  D:  06/25/2011  T:  06/26/2011  Job:  [id number]
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
## 2011-07-16 ENCOUNTER — Ambulatory Visit (INDEPENDENT_AMBULATORY_CARE_PROVIDER_SITE_OTHER): Payer: BC Managed Care – PPO | Admitting: Orthopedic Surgery

## 2011-07-16 ENCOUNTER — Encounter (INDEPENDENT_AMBULATORY_CARE_PROVIDER_SITE_OTHER): Payer: Self-pay | Admitting: Orthopedic Surgery

## 2011-07-16 VITALS — BP 132/80 | HR 70 | Temp 97.8°F | Resp 18 | Ht 68.0 in | Wt 124.2 lb

## 2011-07-16 DIAGNOSIS — S0085XA Superficial foreign body of other part of head, initial encounter: Secondary | ICD-10-CM

## 2011-07-16 DIAGNOSIS — T148XXA Other injury of unspecified body region, initial encounter: Secondary | ICD-10-CM

## 2011-07-16 DIAGNOSIS — S0005XA Superficial foreign body of scalp, initial encounter: Secondary | ICD-10-CM

## 2011-07-16 DIAGNOSIS — T753XXA Motion sickness, initial encounter: Secondary | ICD-10-CM

## 2011-07-16 DIAGNOSIS — M259 Joint disorder, unspecified: Secondary | ICD-10-CM | POA: Insufficient documentation

## 2011-07-16 DIAGNOSIS — S00452A Superficial foreign body of left ear, initial encounter: Secondary | ICD-10-CM

## 2011-07-16 MED ORDER — SCOPOLAMINE 1 MG/3DAYS TD PT72: 1.0000 | MEDICATED_PATCH | TRANSDERMAL | Status: AC
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
## 2011-07-23 ENCOUNTER — Encounter (INDEPENDENT_AMBULATORY_CARE_PROVIDER_SITE_OTHER): Payer: BC Managed Care – PPO

## 2012-12-13 IMAGING — CT THORAX^CHEST_ABDOMEN_PELVIS (ADULT)
4 of 12 series · 14 of 37 positions shown, 18 images · IV contrast (APPLIED)
Comparison: Chest x-ray from the same day.

CT CHEST

***ADDENDUM*** CREATED: 06/16/2011 [DATE]

A comminuted left mid shaft clavicle fracture is evident.  The
sternoclavicular and AC joints are intact.
***END ADDENDUM*** SIGNED BY: Jhansi Troche, M.D.
CLINICAL DATA: Trauma.  Pain.
CT CHEST, ABDOMEN AND PELVIS WITH CONTRAST
TECHNIQUE: Multidetector CT imaging of the chest, abdomen and
pelvis was performed following the standard protocol during bolus
administration of intravenous contrast.
Contrast: 100mL OMNIPAQUE IOHEXOL 300 MG/ML IV SOLN

[Series 2: c/a/p 5.0 b31f · axial · 0.62mm/px · z∈[-544,-219]mm · 3 of 131 slices shown, 7 images]
[im 33/131  mediastinal]
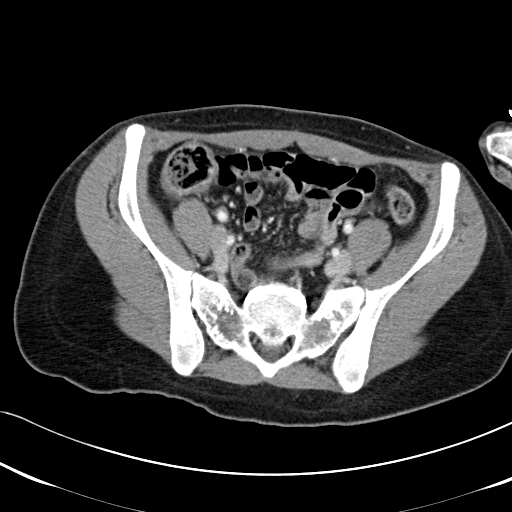
[im 33/131  lung]
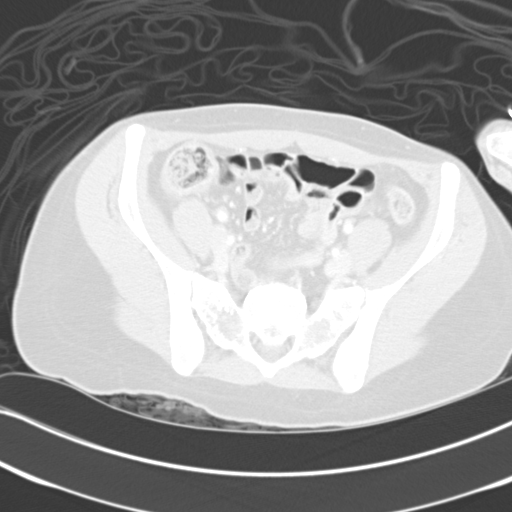
[im 33/131  bone]
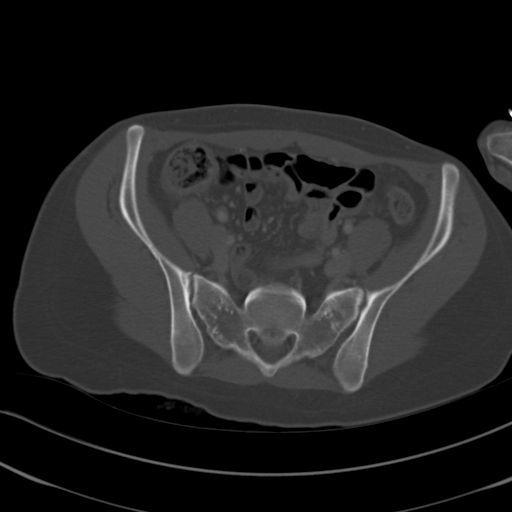
[im 66/131  mediastinal]
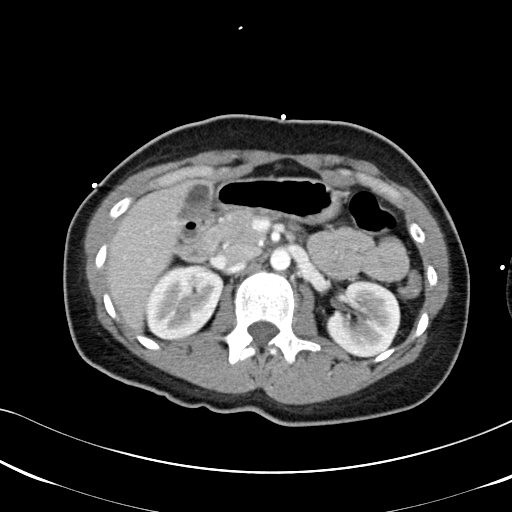
[im 66/131  lung]
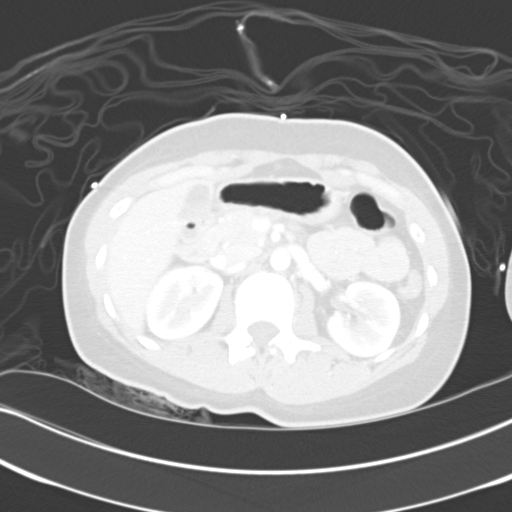
[im 98/131  mediastinal]
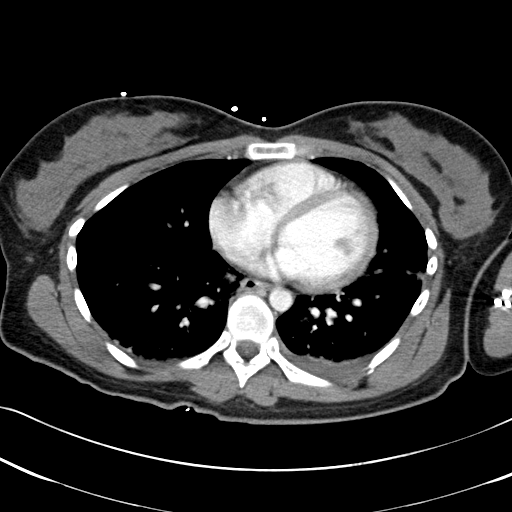
[im 98/131  lung]
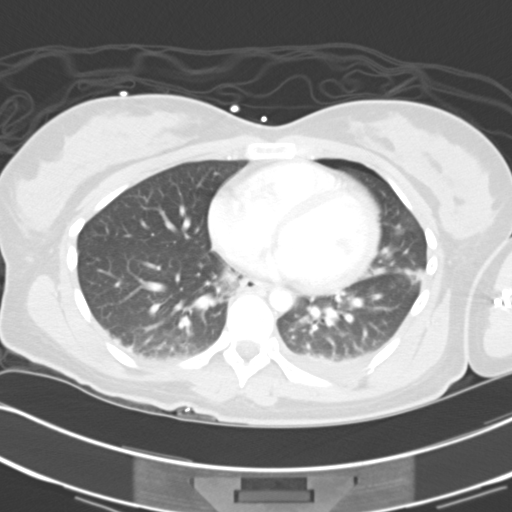

[Series 10: t-spine · axial · 0.62mm/px · z∈[-300,-136]mm · 3 of 165 slices shown]
[im 42/165  mediastinal]
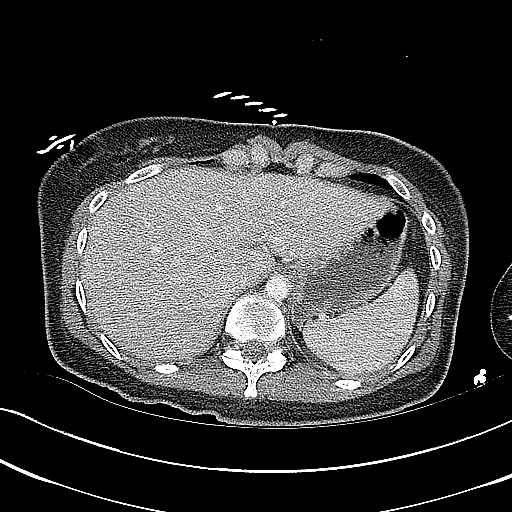
[im 83/165  mediastinal]
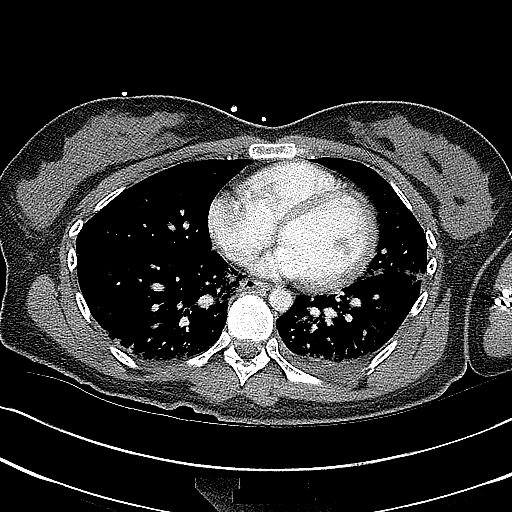
[im 124/165  mediastinal]
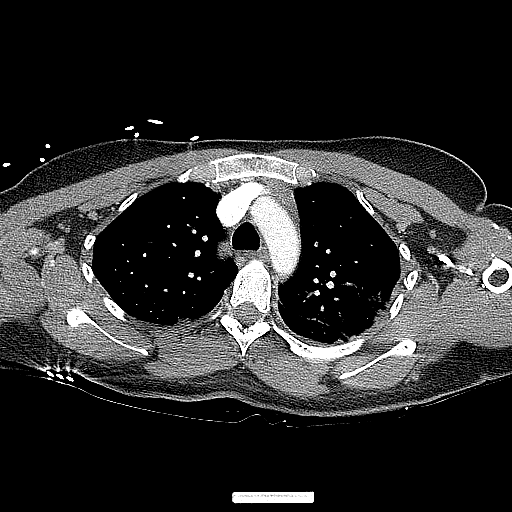

[Series 11: l-spine · axial · 0.62mm/px · z∈[-488,-272]mm · 4 of 181 slices shown]
[im 37/181  mediastinal]
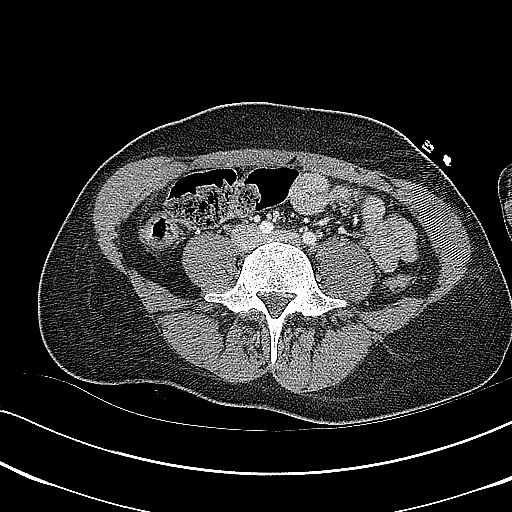
[im 73/181  mediastinal]
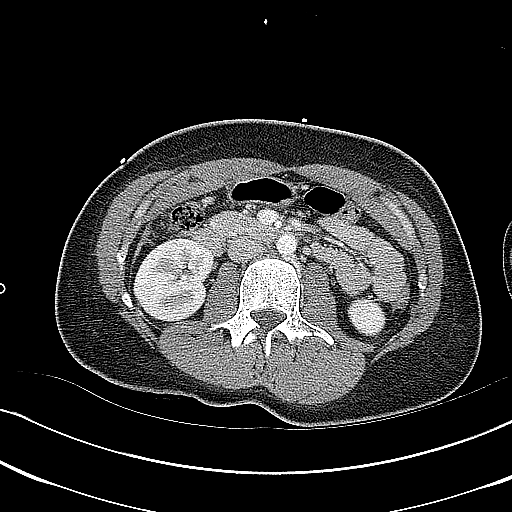
[im 109/181  mediastinal]
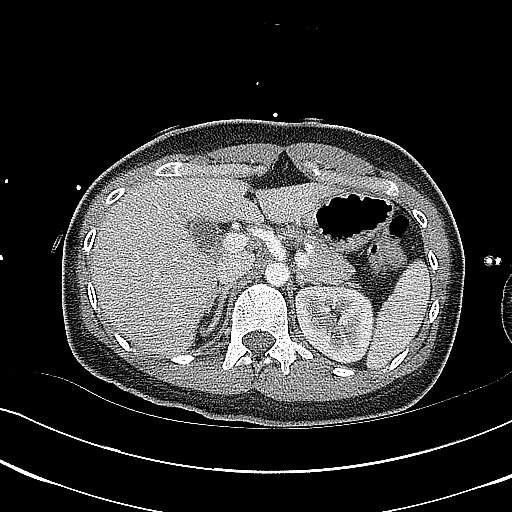
[im 145/181  mediastinal]
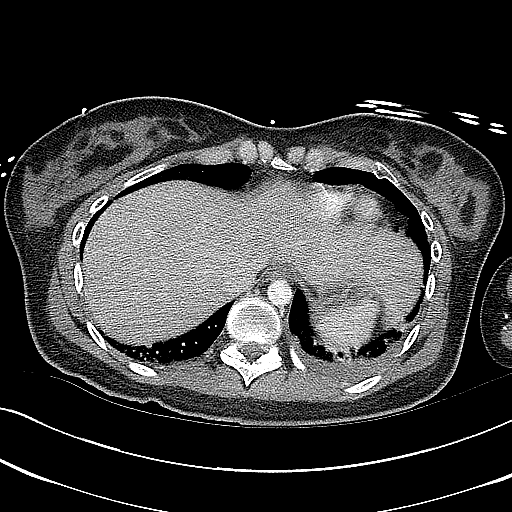

[Series 607: axial l-spine · axial · 0.71mm/px · z∈[-460,-255]mm · 4 of 171 slices shown]
[im 35/171  mediastinal]
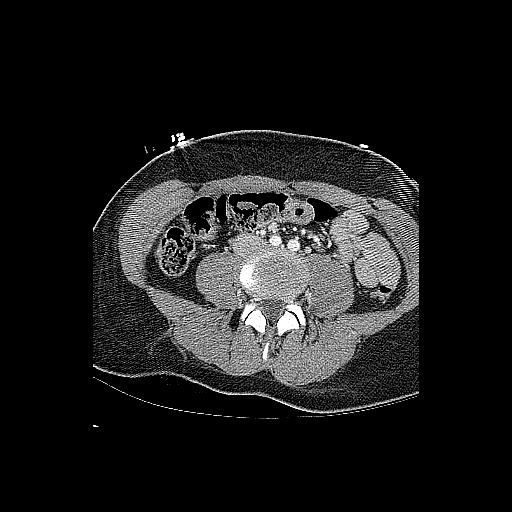
[im 69/171  mediastinal]
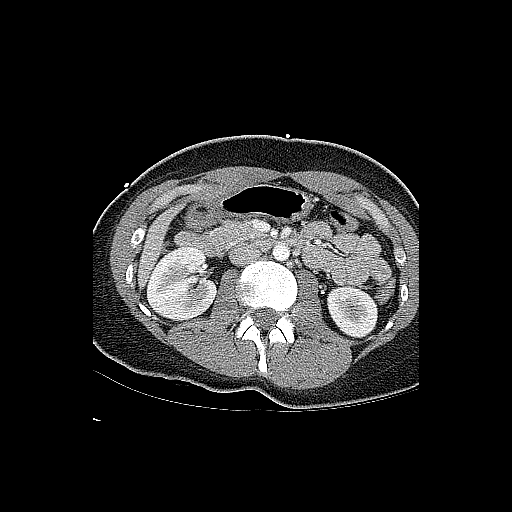
[im 103/171  mediastinal]
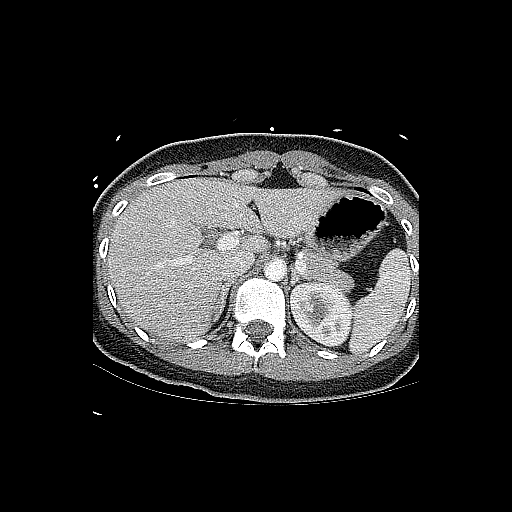
[im 137/171  mediastinal]
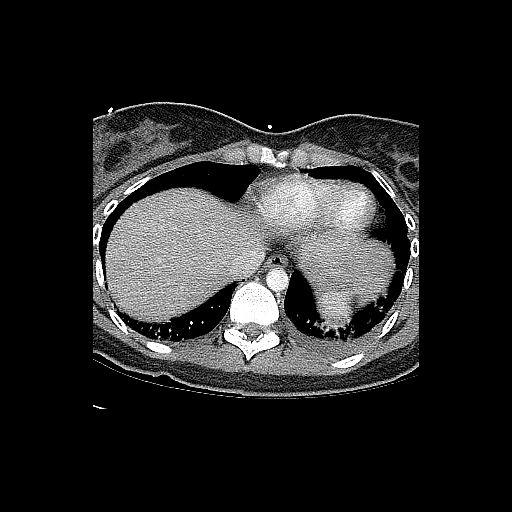

[14 of 37 positions shown; findings below may reference images not displayed]

FINDINGS: Nondisplaced left second and third rib fractures are
present.  The left fourth posterior fracture is displaced one shaft
width.  This is a segmental fracture with a more lateral fracture
noted as well.  The nondisplaced posterior left fifth sixth,
seventh, eighth, and ninth rib fractures are also present.  There
are more lateral fractures involving the left fourth, fifth, sixth,
seventh ribs.  A small left pneumothorax is present.  The airspace
disease involving the posterior left upper lobe is compatible with
contusion.  There is contusion involving the superior segment of
the left lower lobe as well.  A left-sided hemothorax is present.

No significant right-sided rib fractures are present.  The thoracic
spine is otherwise intact.

There is minimal atelectasis at the right lung base.  No other
focal airspace disease is present on the right.  The heart size is
normal.

No other fractures are evident.  The soft tissues and removal are
unremarkable.
IMPRESSION: 1.  Multiple left-sided rib fractures as detailed above.  Several
these fractures are segmental.
2.  Small left pneumothorax.
3.  The left pulmonary contusion involving the left upper lobe and
portions of the superior segment left lower lobe.
4.  Left hemothorax.

CT ABDOMEN AND PELVIS
FINDINGS: The liver and spleen are within normal limits.  The
stomach, duodenum, pancreas are unremarkable.  The common bile duct
and gallbladder are normal.  The adrenal glands and kidneys are
normal bilaterally with the exception of a central sinus cyst on
the left.  No significant adenopathy or free fluid is present.

The rectosigmoid colon is within normal limits.  The remainder of
the colon is unremarkable.  The appendix is visualized and within
normal limits.  The uterus adnexa are normal for age.

The bone windows demonstrate to minimally-displaced fractures of
the left second, third, and fourth lumbar transverse processes.
IMPRESSION: 1.  No acute or focal abnormality of the internal organs.
2.  Minimally-displaced fractures involving the left second, third,
and fourth transverse processes
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
## 2012-12-15 IMAGING — RF ORIF LT CLAVICLE
1 series · 4 of 4 positions shown · non-contrast
Comparison: 06/16/2011

CLINICAL DATA: Left clavicle fracture

LEFT CLAVICLE - 2+ VIEWS

[Series 1: run · 4 of 4 slices shown]
[im 1/4]
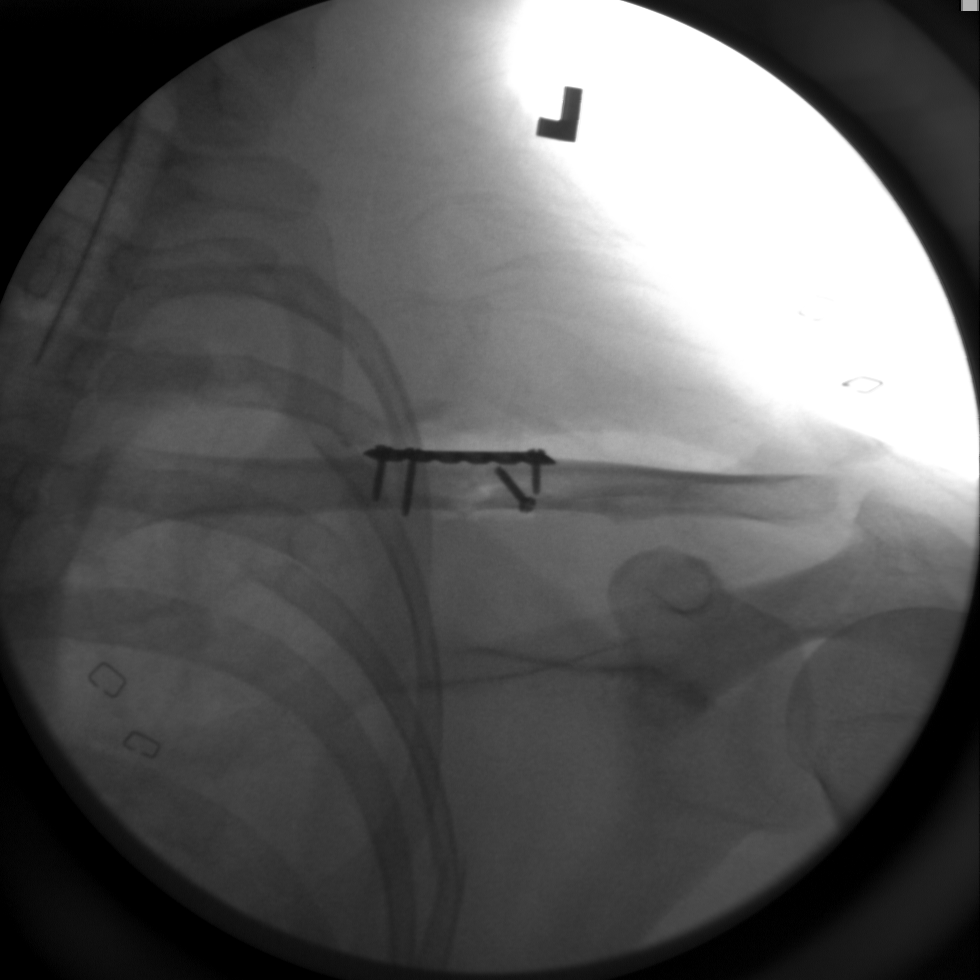
[im 2/4]
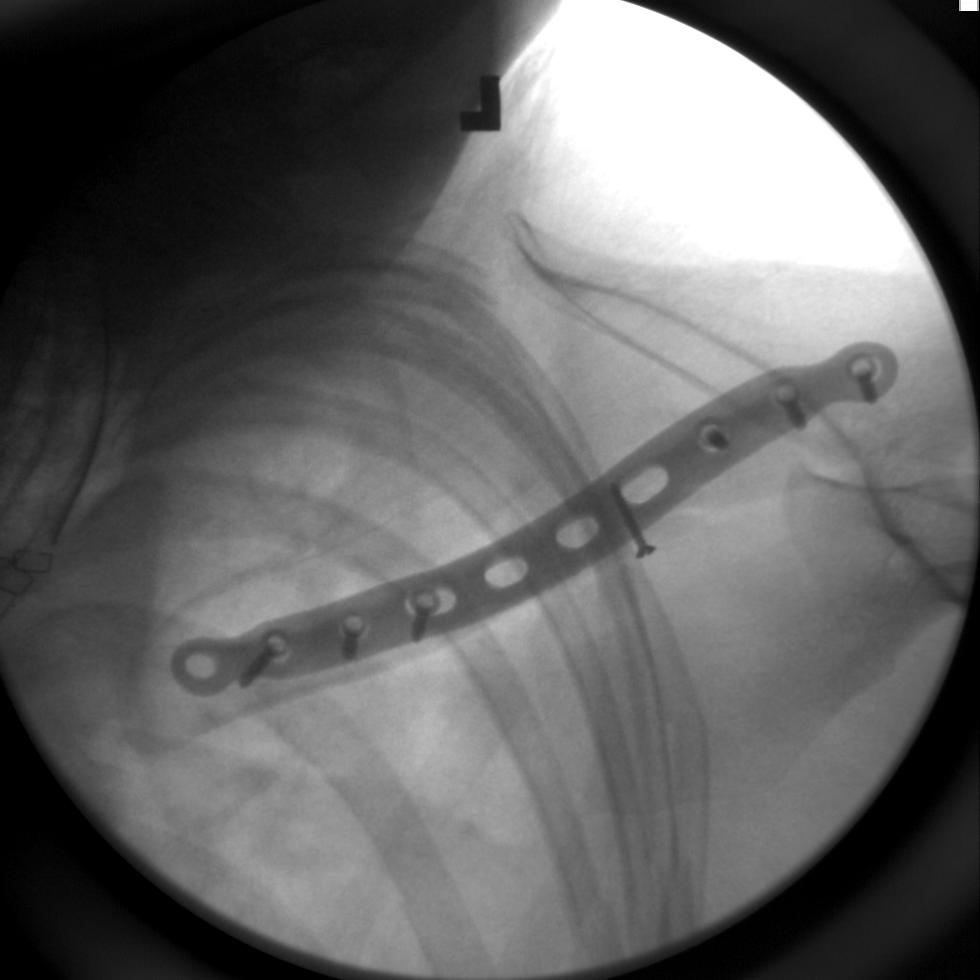
[im 3/4]
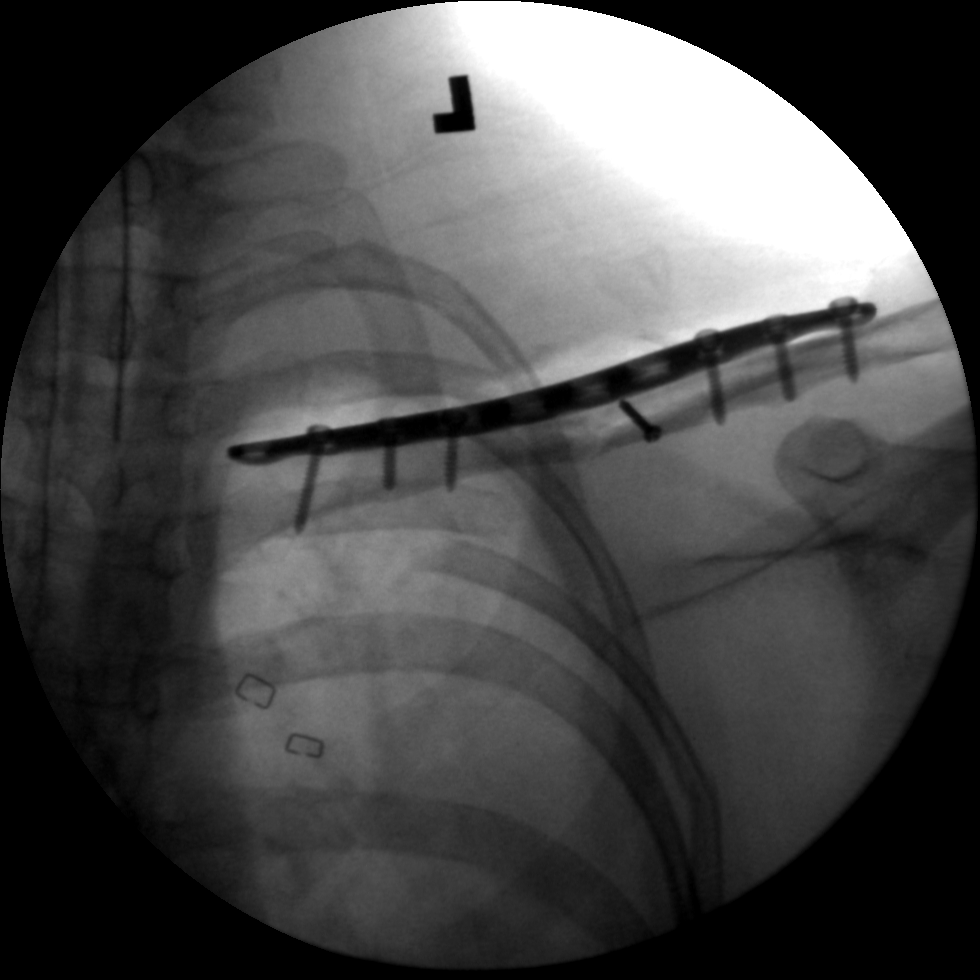
[im 4/4]
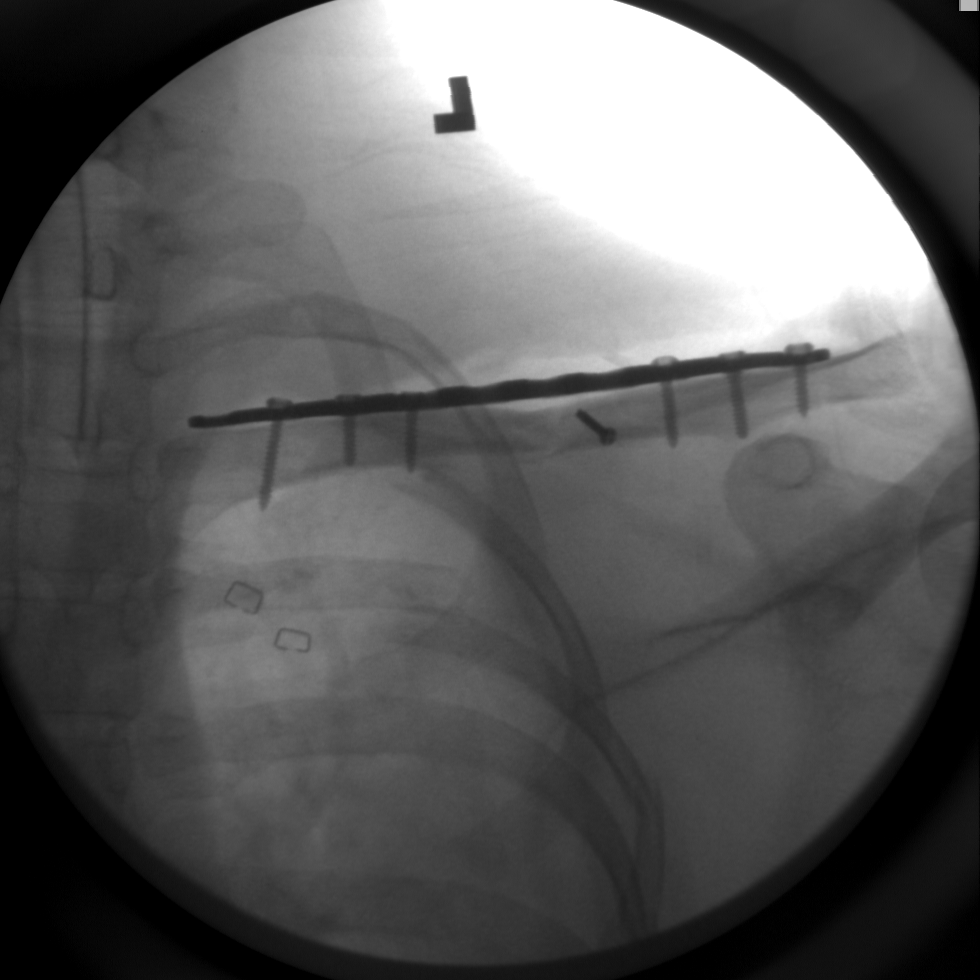

[4 of 4 positions shown; findings below may reference images not displayed]

FINDINGS: Anatomic position alignment has been restored following
placement of plate and screws across a comminuted mid clavicle
fracture.
IMPRESSION: As above.

## 2012-12-17 IMAGING — CR DG CHEST 1V PORT
1 series · 1 of 1 positions shown · non-contrast
Comparison: Portable chest x-ray earlier same date 2635 hours.

CLINICAL DATA: Left chest tube placement for pneumothorax.

PORTABLE CHEST - 1 VIEW [DATE]/9579 7559 hours:

[AP]
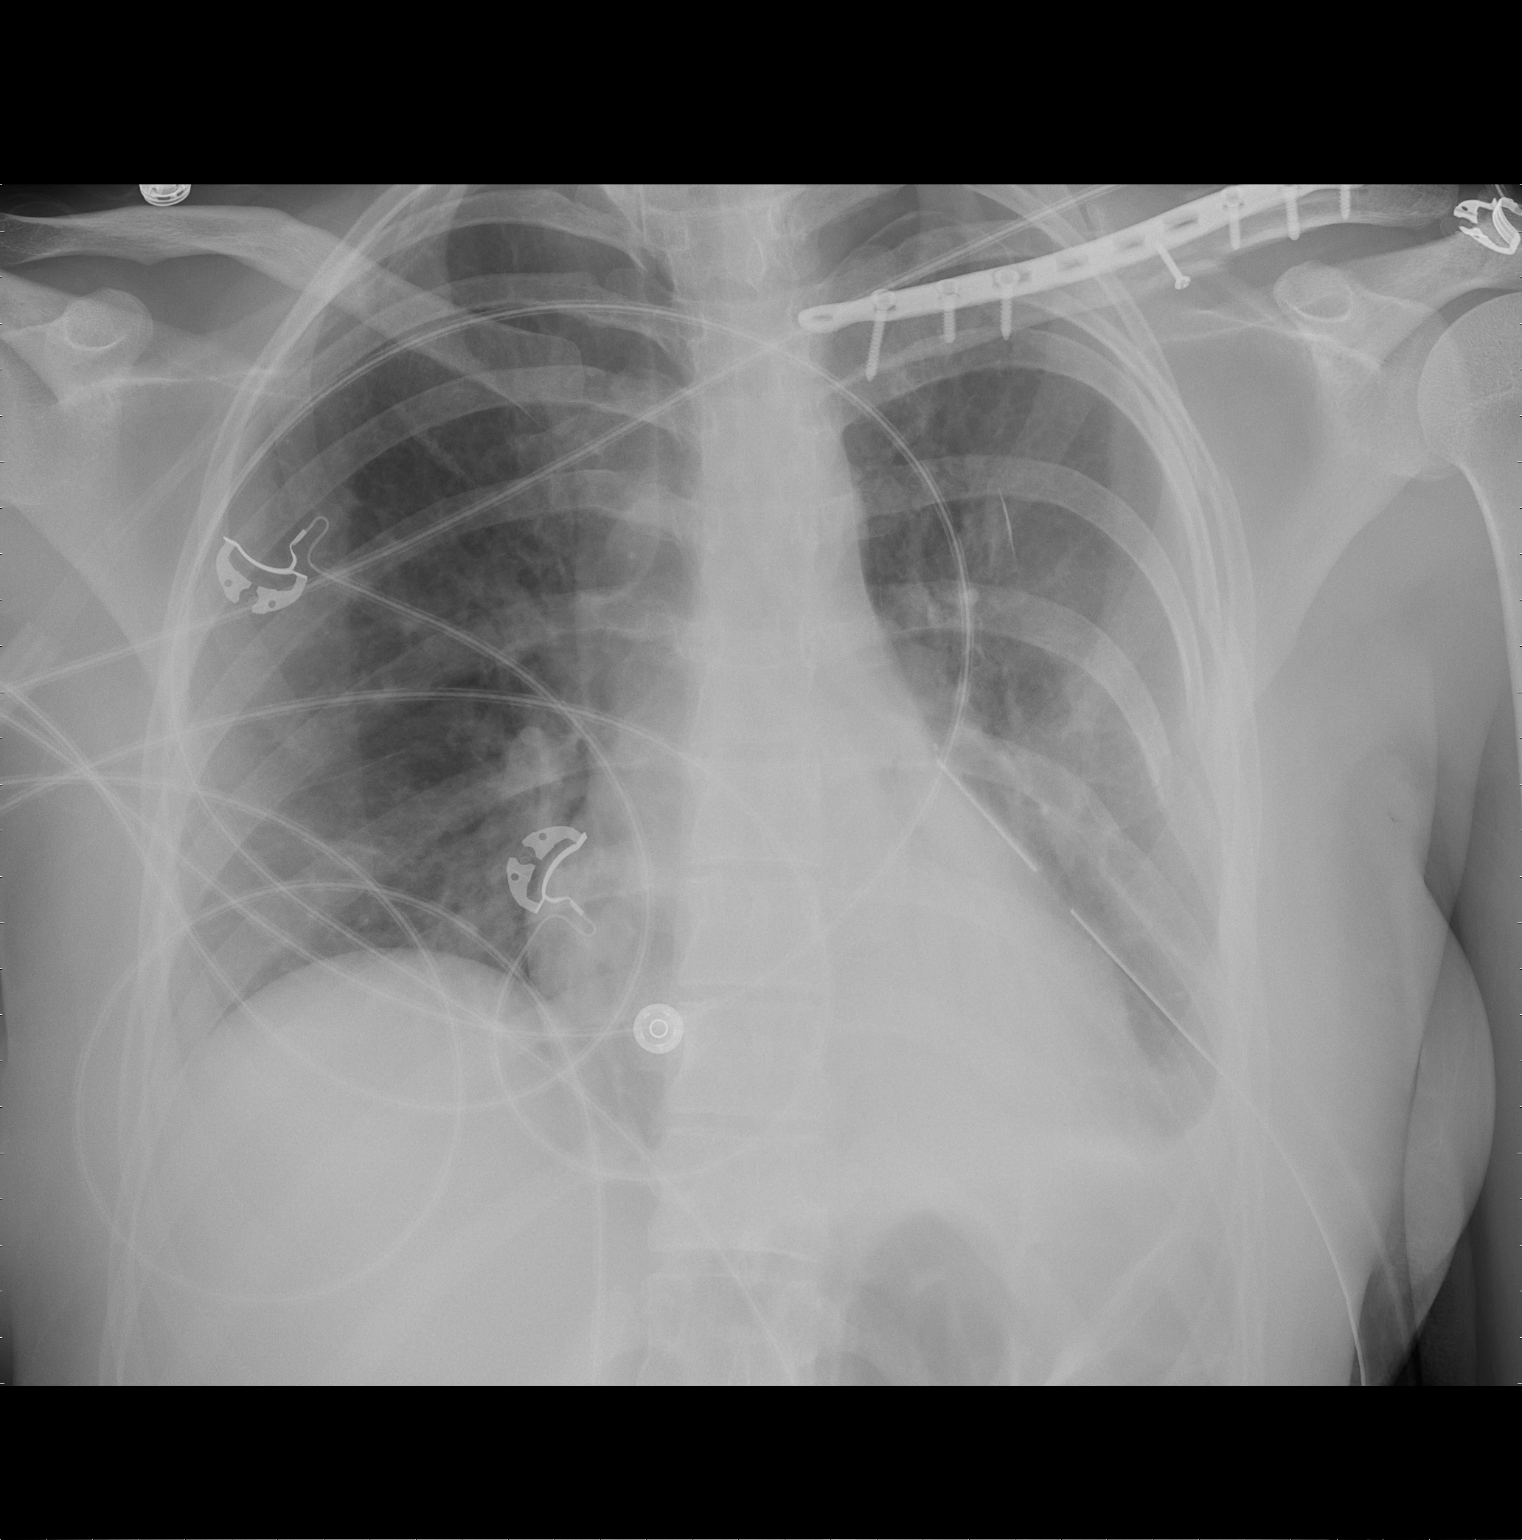

[1 of 1 positions shown; findings below may reference images not displayed]

FINDINGS: Interval left chest tube placement with improvement in
the left pneumothorax, though a small (10-15%) left apical
pneumothorax persists.  Numerous left rib fractures again noted.
Stable moderate sized left pleural effusion/ hemothorax.  Stable
atelectasis in the left lower lobe.  Right lung remains clear.
IMPRESSION: 1.  Improved left pneumothorax after chest tube placement, with a
residual approximate 10-15% apical pneumothorax.
2.  Stable moderate sized left pleural effusion/hemothorax and
stable left lower lobe atelectasis.  No new abnormalities.

## 2016-02-10 ENCOUNTER — Other Ambulatory Visit (INDEPENDENT_AMBULATORY_CARE_PROVIDER_SITE_OTHER): Payer: BC Managed Care – PPO

## 2016-02-10 ENCOUNTER — Other Ambulatory Visit (HOSPITAL_COMMUNITY)
Admission: RE | Admit: 2016-02-10 | Discharge: 2016-02-10 | Disposition: A | Discharge status: Home / Self Care | Payer: BC Managed Care – PPO | Source: Ambulatory Visit | Attending: Obstetrics and Gynecology | Admitting: Obstetrics and Gynecology

## 2016-02-10 ENCOUNTER — Other Ambulatory Visit: Payer: Self-pay | Admitting: Obstetrics and Gynecology

## 2016-02-10 ENCOUNTER — Ambulatory Visit (INDEPENDENT_AMBULATORY_CARE_PROVIDER_SITE_OTHER): Payer: BC Managed Care – PPO | Admitting: Obstetrics and Gynecology

## 2016-02-10 ENCOUNTER — Encounter: Payer: Self-pay | Admitting: Obstetrics and Gynecology

## 2016-02-10 VITALS — BP 110/60 | Ht 68.0 in | Wt 161.5 lb

## 2016-02-10 DIAGNOSIS — Z01419 Encounter for gynecological examination (general) (routine) without abnormal findings: Secondary | ICD-10-CM | POA: Insufficient documentation

## 2016-02-10 DIAGNOSIS — B9689 Other specified bacterial agents as the cause of diseases classified elsewhere: Secondary | ICD-10-CM | POA: Insufficient documentation

## 2016-02-10 DIAGNOSIS — A499 Bacterial infection, unspecified: Secondary | ICD-10-CM

## 2016-02-10 DIAGNOSIS — Z01411 Encounter for gynecological examination (general) (routine) with abnormal findings: Secondary | ICD-10-CM

## 2016-02-10 DIAGNOSIS — N760 Acute vaginitis: Secondary | ICD-10-CM | POA: Diagnosis not present

## 2016-02-10 DIAGNOSIS — N9489 Other specified conditions associated with female genital organs and menstrual cycle: Secondary | ICD-10-CM | POA: Diagnosis not present

## 2016-02-10 DIAGNOSIS — Z975 Presence of (intrauterine) contraceptive device: Secondary | ICD-10-CM | POA: Diagnosis not present

## 2016-02-10 DIAGNOSIS — L237 Allergic contact dermatitis due to plants, except food: Secondary | ICD-10-CM

## 2016-02-10 DIAGNOSIS — Z30431 Encounter for routine checking of intrauterine contraceptive device: Secondary | ICD-10-CM | POA: Diagnosis not present

## 2016-02-10 DIAGNOSIS — N898 Other specified noninflammatory disorders of vagina: Secondary | ICD-10-CM | POA: Insufficient documentation

## 2016-02-10 LAB — POCT WET PREP (WET MOUNT): Trichomonas Wet Prep HPF POC: NEGATIVE

## 2016-02-10 MED ORDER — METRONIDAZOLE 500 MG PO TABS: 500.0000 mg | ORAL_TABLET | Freq: Two times a day (BID) | ORAL | 2 refills | Status: DC

## 2016-02-10 NOTE — Progress Notes (Signed)
TV (non ob) US done today for IUD placement. LMP unknown d/t the IUD. UT WNL. RT OV WNL. LT OV nonvisualized due to bowel gas. ENDO subvisualized d/t IUD. IUD is in correct position within endometrial cavity.

## 2016-02-10 NOTE — Progress Notes (Addendum)
Patient ID: Sarah Wood, female   DOB: 04-Sep-1985, 30 y.o.   MRN: [id number]  Assessment:  Annual Gyn Exam BV Wet prep and KOH collected - + WHIFF test for BV; moderate clue and white cells   IUD string not visible. Poison oak  Plan:  1. pap smear done, next pap due in 3 years if negative  2. return annually or prn 3    Annual mammogram and regular self breast exams advised 4. Rx Metronidazole 500 mg bid x7days  5 u/s today to IUD position Subjective:  Sarah Wood is a 30 y.o. female G2P2002 who presents for annual exam. No LMP recorded. Patient is not currently having periods (Reason: IUD).   She complains of post-coital vaginal bleeding dyspareunia. Pt also complains of vaginal itching, vaginal odor and copious vaginal discharge. She states that the odor is always present, but the bleeding only occurs after sexual contact. She states she has not noticed any differences in the smell of her sweat or axilla. Pt states she is sexually active with her husband only, who has recently been checked for STDs per her request. She notes that she does not have regular periods on the IUD, but occasionally has a few hours of spotting, for which she uses a tampon.   She also notes she has a rash to her upper and lower extremities after coming into contact with poison ivy this week. She reports that she took a Prednisone tablet this morning that was left over from a refill she had after having poison ivy earlier in the summer.   The following portions of the patient's history were reviewed and updated as appropriate: allergies, current medications, past family history, past medical history, past social history, past surgical history and problem list. Past Medical History:  Diagnosis Date  . Chills   . Clavicle fracture    right  . Cough   . Leg swelling   . Sore throat   . Vomiting   . Weakness   . Wrist fracture, bilateral     Past Surgical History:  Procedure Laterality  Date  . ANTERIOR CRUCIATE LIGAMENT REPAIR  06/25/2011   Procedure: RECONSTRUCTION ANTERIOR CRUCIATE LIGAMENT (ACL) WITH HAMSTRING GRAFT;  Surgeon: Cammy Copa;  Location: [facility] OR;  Service: Orthopedics;  Laterality: Right;  Right Knee Anterior Cructiate Ligament Reconstruction with Hamstring Autograft  . NO PAST SURGERIES    . ORIF CLAVICULAR FRACTURE  06/18/2011   Procedure: OPEN REDUCTION INTERNAL FIXATION (ORIF) CLAVICULAR FRACTURE;  Surgeon: Budd Palmer;  Location: [facility] OR;  Service: Orthopedics;  Laterality: Left;     Current Outpatient Prescriptions:  .  levonorgestrel (MIRENA) 20 MCG/24HR IUD, 1 each by Intrauterine route once., Disp: , Rfl:  .  methylPREDNISolone (MEDROL DOSEPAK) 4 MG TBPK tablet, , Disp: , Rfl:   Review of Systems Constitutional: negative Gastrointestinal: negative Genitourinary: positive for vaginal odor and itching, dyspareunia, post-coital vaginal bleeding, vaginal discharge   Objective:  BP 110/60 (BP Location: Right Arm, Patient Position: Sitting, Cuff Size: Normal)   Ht  (1.727 m)   Wt 161 lb 8 oz (73.3 kg)   BMI 24.56 kg/m    BMI: Body mass index is 24.56 kg/m.  General Appearance: Alert, appropriate appearance for age. No acute distress HEENT: Grossly normal facial poison oak on medrol dosepak Neck / Thyroid:  Cardiovascular: RRR; normal S1, S2, no murmur Lungs: CTA bilaterally Back: No CVAT Breast Exam: No masses or nodes.No dimpling, nipple retraction or discharge. Gastrointestinal:  Soft, non-tender, no masses or organomegaly Pelvic Exam:  External genitalia: normal general appearance Vaginal: normal mucosa without prolapse or lesions, minimal normal appearing vaginal secretions  Cervix: normal appearance Adnexa: normal bimanual exam Uterus: normal single, nontender Rectovaginal: not indicated Lymphatic Exam: Non-palpable nodes in neck, clavicular, axillary, or inguinal regions  Skin: poison oak on cheek and face Neurologic:  Normal gait and speech, no tremor  Psychiatric: Alert and oriented, appropriate affect.  Urinalysis:Not done ` Christin Bach. MD Pgr [phone] 2:32 PM    By signing my name below, I, Doreatha Martin, attest that this documentation has been prepared under the direction and in the presence of Tilda Burrow, MD. Electronically Signed: Doreatha Martin, ED Scribe. 02/10/16. 2:32 PM.  I personally performed the services described in this documentation, which was SCRIBED in my presence. The recorded information has been reviewed and considered accurate. It has been edited as necessary during review. Tilda Burrow, MD

## 2016-02-11 LAB — GC/CHLAMYDIA PROBE AMP
Chlamydia trachomatis, NAA: NEGATIVE
Neisseria gonorrhoeae by PCR: NEGATIVE

## 2016-02-12 LAB — CYTOLOGY - PAP

## 2021-07-10 ENCOUNTER — Ambulatory Visit
Admission: EM | Admit: 2021-07-10 | Discharge: 2021-07-10 | Disposition: A | Discharge status: Home / Self Care | Payer: BC Managed Care – PPO | Attending: Emergency Medicine | Admitting: Emergency Medicine

## 2021-07-10 ENCOUNTER — Other Ambulatory Visit: Payer: Self-pay

## 2021-07-10 ENCOUNTER — Encounter: Payer: Self-pay | Admitting: Emergency Medicine

## 2021-07-10 ENCOUNTER — Ambulatory Visit: Payer: Self-pay

## 2021-07-10 DIAGNOSIS — J0100 Acute maxillary sinusitis, unspecified: Secondary | ICD-10-CM | POA: Diagnosis not present

## 2021-07-10 MED ORDER — FLUTICASONE PROPIONATE 50 MCG/ACT NA SUSP: 2.0000 | Freq: Every day | NASAL | 0 refills | Status: DC

## 2021-07-10 MED ORDER — IBUPROFEN 600 MG PO TABS: 600.0000 mg | ORAL_TABLET | Freq: Four times a day (QID) | ORAL | 0 refills | Status: DC | PRN

## 2021-07-10 MED ORDER — PROMETHAZINE-DM 6.25-15 MG/5ML PO SYRP: 5.0000 mL | ORAL_SOLUTION | Freq: Four times a day (QID) | ORAL | 0 refills | Status: DC | PRN

## 2021-07-10 MED ORDER — AMOXICILLIN-POT CLAVULANATE 875-125 MG PO TABS: 1.0000 | ORAL_TABLET | Freq: Two times a day (BID) | ORAL | 0 refills | Status: DC

## 2021-07-10 NOTE — ED Provider Notes (Signed)
HPI  SUBJECTIVE:  Sarah Wood is a 35 y.o. female who presents with 8 days of nasal congestion, maxillary sinus pain and pressure, greenish-brown rhinorrhea, cough worse in the morning productive of the same material as her nasal congestion, postnasal drip.  States that she has felt feverish, but has not measured her temperature.  No body aches, facial swelling, upper dental pain, double sickening, wheezing, shortness of breath.  She is unable to sleep secondary to the cough.  No antibiotics in the past month.  She took DayQuil within 6 hours of evaluation.  She has also tried rest, NyQuil, Robitussin without improvement in her symptoms.  No aggravating factors.  She has no past medical history.  LMP: Amenorrheic.  Has a Mirena IUD.  Denies possibility being pregnant.  PMD: None    Past Medical History:  Diagnosis Date   Chills    Clavicle fracture    right   Cough    Leg swelling    Sore throat    Vomiting    Weakness    Wrist fracture, bilateral     Past Surgical History:  Procedure Laterality Date   ANTERIOR CRUCIATE LIGAMENT REPAIR  06/25/2011   Procedure: RECONSTRUCTION ANTERIOR CRUCIATE LIGAMENT (ACL) WITH HAMSTRING GRAFT;  Surgeon: Cammy Copa;  Location: [facility];  Service: Orthopedics;  Laterality: Right;  Right Knee Anterior Cructiate Ligament Reconstruction with Hamstring Autograft   NO PAST SURGERIES     ORIF CLAVICULAR FRACTURE  06/18/2011   Procedure: OPEN REDUCTION INTERNAL FIXATION (ORIF) CLAVICULAR FRACTURE;  Surgeon: Budd Palmer;  Location: [facility];  Service: Orthopedics;  Laterality: Left;    Family History  Problem Relation Age of Onset   Cancer Paternal Aunt        ovarian   Cancer Maternal Grandfather        lung    Social History   Tobacco Use   Smoking status: Former    Packs/day: 0.50    Years: 6.00    Pack years: 3.00    Types: Cigarettes    Quit date: 05/17/2011    Years since quitting: 10.1   Smokeless tobacco: Never   Substance Use Topics   Alcohol use: Yes    Alcohol/week: 1.0 standard drink    Types: 1 Glasses of wine per week   Drug use: No    No current facility-administered medications for this encounter.  Current Outpatient Medications:    amoxicillin-clavulanate (AUGMENTIN) 875-125 MG tablet, Take 1 tablet by mouth 2 (two) times daily. X 7 days, Disp: 14 tablet, Rfl: 0   fluticasone (FLONASE) 50 MCG/ACT nasal spray, Place 2 sprays into both nostrils daily., Disp: 16 g, Rfl: 0   ibuprofen (ADVIL) 600 MG tablet, Take 1 tablet (600 mg total) by mouth every 6 (six) hours as needed., Disp: 30 tablet, Rfl: 0   promethazine-dextromethorphan (PROMETHAZINE-DM) 6.25-15 MG/5ML syrup, Take 5 mLs by mouth 4 (four) times daily as needed for cough., Disp: 118 mL, Rfl: 0   levonorgestrel (MIRENA) 20 MCG/24HR IUD, 1 each by Intrauterine route once., Disp: , Rfl:   Allergies  Allergen Reactions   Cephalexin Hives     ROS  As noted in HPI.   Physical Exam  BP 123/82 (BP Location: Right Arm)    Pulse 89    Temp 98.1 F (36.7 C) (Oral)    Resp 18    SpO2 97%   Constitutional: Well developed, well nourished, no acute distress Eyes:  EOMI, conjunctiva normal bilaterally HENT: Normocephalic,  atraumatic,mucus membranes moist.  Purulent nasal drainage.  Erythematous, swollen turbinates.  Positive maxillary sinus tenderness.  Positive postnasal drip. Respiratory: Normal inspiratory effort lungs clear bilaterally Cardiovascular: Normal rate GI: nondistended skin: No rash, skin intact Musculoskeletal: no deformities Neurologic: Alert & oriented x 3, no focal neuro deficits Psychiatric: Speech and behavior appropriate   ED Course   Medications - No data to display  No orders of the defined types were placed in this encounter.   No results found for this or any previous visit (from the past 24 hour(s)). No results found.  ED Clinical Impression  1. Acute non-recurrent maxillary sinusitis       ED Assessment/Plan  Patient with maxillary sinusitis.  Sending home on Augmentin due to duration of symptoms.  States that she has tolerated penicillins before.  Also Mucinex D, Tylenol/ibuprofen, Flonase, saline nasal irrigation, Promethazine DM.  Discontinue DayQuil/NyQuil.  Follow-up with PMD of choice as needed.  Referring to family medicine of [place].  will also order assistance in finding a PMD.  Discussed MDM, treatment plan, and plan for follow-up with patient. patient agrees with plan.   Meds ordered this encounter  Medications   fluticasone (FLONASE) 50 MCG/ACT nasal spray    Sig: Place 2 sprays into both nostrils daily.    Dispense:  16 g    Refill:  0   amoxicillin-clavulanate (AUGMENTIN) 875-125 MG tablet    Sig: Take 1 tablet by mouth 2 (two) times daily. X 7 days    Dispense:  14 tablet    Refill:  0   ibuprofen (ADVIL) 600 MG tablet    Sig: Take 1 tablet (600 mg total) by mouth every 6 (six) hours as needed.    Dispense:  30 tablet    Refill:  0   promethazine-dextromethorphan (PROMETHAZINE-DM) 6.25-15 MG/5ML syrup    Sig: Take 5 mLs by mouth 4 (four) times daily as needed for cough.    Dispense:  118 mL    Refill:  0      *This clinic note was created using Scientist, clinical (histocompatibility and immunogenetics). Therefore, there may be occasional mistakes despite careful proofreading.  ?    Domenick Gong, MD 07/11/21 1230

## 2021-07-10 NOTE — ED Triage Notes (Signed)
Fever, nasal congestion, cough, sore throat, since 12/20.  States ears keep popping.  States her voice is raspy.  Has tried over the counter medications with out relief.

## 2021-07-10 NOTE — Discharge Instructions (Addendum)
Start Mucinex-D to keep the mucous thin and to decongest you.   You may take 600 mg of motrin with 1000 mg of tylenol up to 3-4 times a day.  Finish the Augmentin, even if you feel better.  Use a [employer] sinus rinse with distilled water as often as you want to to reduce nasal congestion. Follow the directions on the box.   Go to www.goodrx.com to look up your medications. This will give you a list of where you can find your prescriptions at the most affordable prices. Or you can ask the pharmacist what the cash price is. This is frequently cheaper than going through insurance.

## 2021-08-11 DIAGNOSIS — B977 Papillomavirus as the cause of diseases classified elsewhere: Secondary | ICD-10-CM | POA: Insufficient documentation

## 2022-02-16 ENCOUNTER — Ambulatory Visit: Payer: Self-pay

## 2022-02-16 ENCOUNTER — Ambulatory Visit
Admission: EM | Admit: 2022-02-16 | Discharge: 2022-02-16 | Disposition: A | Discharge status: Home / Self Care | Payer: BC Managed Care – PPO | Attending: Nurse Practitioner | Admitting: Nurse Practitioner

## 2022-02-16 ENCOUNTER — Ambulatory Visit (INDEPENDENT_AMBULATORY_CARE_PROVIDER_SITE_OTHER): Payer: BC Managed Care – PPO

## 2022-02-16 DIAGNOSIS — R0982 Postnasal drip: Secondary | ICD-10-CM

## 2022-02-16 DIAGNOSIS — R059 Cough, unspecified: Secondary | ICD-10-CM

## 2022-02-16 DIAGNOSIS — R051 Acute cough: Secondary | ICD-10-CM | POA: Diagnosis not present

## 2022-02-16 MED ORDER — CETIRIZINE HCL 10 MG PO TABS: 10.0000 mg | ORAL_TABLET | Freq: Every day | ORAL | 0 refills | Status: DC

## 2022-02-16 MED ORDER — FLUTICASONE PROPIONATE 50 MCG/ACT NA SUSP: 2.0000 | Freq: Every day | NASAL | 0 refills | Status: DC

## 2022-02-16 MED ORDER — FLUCONAZOLE 150 MG PO TABS: 150.0000 mg | ORAL_TABLET | Freq: Once | ORAL | 0 refills | Status: AC

## 2022-02-16 MED ORDER — AMOXICILLIN-POT CLAVULANATE 875-125 MG PO TABS: 1.0000 | ORAL_TABLET | Freq: Two times a day (BID) | ORAL | 0 refills | Status: AC

## 2022-02-16 MED ORDER — PROMETHAZINE-DM 6.25-15 MG/5ML PO SYRP: 5.0000 mL | ORAL_SOLUTION | Freq: Every evening | ORAL | 0 refills | Status: DC | PRN

## 2022-02-16 MED ORDER — BENZONATATE 100 MG PO CAPS: 100.0000 mg | ORAL_CAPSULE | Freq: Three times a day (TID) | ORAL | 0 refills | Status: DC | PRN

## 2022-02-16 NOTE — Discharge Instructions (Addendum)
-   Chest x-ray today does not show any pneumonia. - Start the flonase and cetirizine to help with allergies to dust  - Please also start the cough suppressants.  You can take the [facility] during the day as long as he do not make you sleepy.  Please only use the cough syrup at nighttime.  Please do not take any over-the-counter cough syrup. -You can continue use cough drops as needed, drink plenty of fluids including water -If your symptoms do not improved by the middle of the week, you can start on the Augmentin and take it twice daily for 7 days to treat sinus infection

## 2022-02-16 NOTE — ED Triage Notes (Signed)
Pt presents with c/o cough for past week, states that she has inhaled a lot of dust from home remodel
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
## 2022-02-18 ENCOUNTER — Telehealth: Payer: Self-pay | Admitting: Emergency Medicine

## 2022-02-18 NOTE — Telephone Encounter (Signed)
Pt called and inquired about possible abx being prescribed as symptoms are not improving. Reviewed discharge instructions and abx was prescribed. Pt reports has been taking and stated no improvement. Discussed with pt to finish abx and if other px medications have not helped symptoms, pt can be re-evaluated. Pt verbalized understanding.

## 2022-02-27 ENCOUNTER — Ambulatory Visit
Admission: RE | Admit: 2022-02-27 | Discharge: 2022-02-27 | Disposition: A | Discharge status: Home / Self Care | Payer: BC Managed Care – PPO | Source: Ambulatory Visit | Attending: Nurse Practitioner | Admitting: Nurse Practitioner

## 2022-02-27 ENCOUNTER — Ambulatory Visit (INDEPENDENT_AMBULATORY_CARE_PROVIDER_SITE_OTHER): Payer: BC Managed Care – PPO

## 2022-02-27 ENCOUNTER — Encounter: Payer: Self-pay | Admitting: Emergency Medicine

## 2022-02-27 VITALS — BP 123/85 | HR 89 | Temp 98.6°F | Resp 18

## 2022-02-27 DIAGNOSIS — R051 Acute cough: Secondary | ICD-10-CM | POA: Diagnosis not present

## 2022-02-27 DIAGNOSIS — R0602 Shortness of breath: Secondary | ICD-10-CM

## 2022-02-27 DIAGNOSIS — R059 Cough, unspecified: Secondary | ICD-10-CM | POA: Diagnosis not present

## 2022-02-27 MED ORDER — NAPROXEN 500 MG PO TABS: 500.0000 mg | ORAL_TABLET | Freq: Two times a day (BID) | ORAL | 0 refills | Status: DC

## 2022-02-27 MED ORDER — CYCLOBENZAPRINE HCL 10 MG PO TABS: 10.0000 mg | ORAL_TABLET | Freq: Every evening | ORAL | 0 refills | Status: DC | PRN

## 2022-02-27 MED ORDER — ALBUTEROL SULFATE HFA 108 (90 BASE) MCG/ACT IN AERS: 1.0000 | INHALATION_SPRAY | Freq: Four times a day (QID) | RESPIRATORY_TRACT | 0 refills | Status: DC | PRN

## 2022-02-27 MED ORDER — BENZONATATE 100 MG PO CAPS: 100.0000 mg | ORAL_CAPSULE | Freq: Three times a day (TID) | ORAL | 0 refills | Status: DC | PRN

## 2022-02-27 MED ORDER — PREDNISONE 50 MG PO TABS: 50.0000 mg | ORAL_TABLET | Freq: Every day | ORAL | 0 refills | Status: DC

## 2022-02-27 NOTE — ED Triage Notes (Signed)
Patient complains came two weeks ago and is no better and she has a productive cough. She finished all meds given and no better.
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
## 2022-02-27 NOTE — Discharge Instructions (Addendum)
-   The chest x-ray is negative for pneumonia, acute fractures, or collapsed lung - This cough may last a few more weeks; please start the prednisone to help with inflammation and you can use albuterol inhaler if you feel short of breath or get in a coughing attack.  The cough should start improving soon. - Start Naproxen to help with the pain from coughing during the day.  You can use Flexeril at night time for muscle pain.  Do not take with Tessalon to prevent excessive sleepiness.  - We will assist you with establishing care with a primary care provider - be on the look out for a message on mychart or a phone call on how to make an appointment with a primary care provider.

## 2022-03-04 ENCOUNTER — Encounter (HOSPITAL_COMMUNITY): Payer: Self-pay

## 2022-05-30 ENCOUNTER — Other Ambulatory Visit: Payer: Self-pay | Admitting: Nurse Practitioner

## 2022-06-06 ENCOUNTER — Other Ambulatory Visit: Payer: Self-pay | Admitting: Nurse Practitioner

## 2024-01-27 DIAGNOSIS — E282 Polycystic ovarian syndrome: Secondary | ICD-10-CM | POA: Insufficient documentation

## 2024-04-04 ENCOUNTER — Ambulatory Visit: Admitting: Podiatry

## 2024-04-04 ENCOUNTER — Ambulatory Visit (INDEPENDENT_AMBULATORY_CARE_PROVIDER_SITE_OTHER)

## 2024-04-04 ENCOUNTER — Encounter: Payer: Self-pay | Admitting: Podiatry

## 2024-04-04 DIAGNOSIS — M722 Plantar fascial fibromatosis: Secondary | ICD-10-CM | POA: Diagnosis not present

## 2024-04-04 MED ORDER — METHYLPREDNISOLONE 4 MG PO TBPK: ORAL_TABLET | ORAL | 0 refills | Status: AC

## 2024-04-04 MED ORDER — MELOXICAM 15 MG PO TABS: 15.0000 mg | ORAL_TABLET | Freq: Every day | ORAL | 3 refills | Status: AC

## 2024-04-04 MED ORDER — TRIAMCINOLONE ACETONIDE 40 MG/ML IJ SUSP
20.0000 mg | Freq: Once | INTRAMUSCULAR | Status: AC
  Administered 2024-04-04: 20 mg

## 2024-04-04 NOTE — Progress Notes (Signed)
 Subjective:  Patient ID: Sarah Wood, female    DOB: [birth year],  MRN: [id number] HPI Chief Complaint  Patient presents with   Foot Pain    Plantar/lateral heel left - started trying to run, has aggravated heel and outer right knee, ongoing x few weeks, AM pain, tried elevating, ice - no help   New Patient (Initial Visit)    38 y.o. female presents with the above complaint.   ROS: Denies fever chills nausea vomit muscle aches pains calf pain back pain chest pain shortness of breath.  Past Medical History:  Diagnosis Date   Chills    Clavicle fracture    right   Cough    Leg swelling    Sore throat    Vomiting    Weakness    Wrist fracture, bilateral    Past Surgical History:  Procedure Laterality Date   ANTERIOR CRUCIATE LIGAMENT REPAIR  06/25/2011   Procedure: RECONSTRUCTION ANTERIOR CRUCIATE LIGAMENT (ACL) WITH HAMSTRING GRAFT;  Surgeon: Cordella Glendia Hutchinson;  Location: [facility];  Service: Orthopedics;  Laterality: Right;  Right Knee Anterior Cructiate Ligament Reconstruction with Hamstring Autograft   NO PAST SURGERIES     ORIF CLAVICULAR FRACTURE  06/18/2011   Procedure: OPEN REDUCTION INTERNAL FIXATION (ORIF) CLAVICULAR FRACTURE;  Surgeon: Ozell VEAR Bruch;  Location: [facility];  Service: Orthopedics;  Laterality: Left;    Current Outpatient Medications:    meloxicam  (MOBIC ) 15 MG tablet, Take 1 tablet (15 mg total) by mouth daily., Disp: 30 tablet, Rfl: 3   methylPREDNISolone  (MEDROL  DOSEPAK) 4 MG TBPK tablet, 6 day dose pack - take as directed, Disp: 21 tablet, Rfl: 0   levonorgestrel (MIRENA) 20 MCG/24HR IUD, 1 each by Intrauterine route once., Disp: , Rfl:    spironolactone (ALDACTONE) 50 MG tablet, Take 50 mg by mouth daily., Disp: , Rfl:   Allergies  Allergen Reactions   Cephalexin Hives   Sulfa Antibiotics Itching   Review of Systems Objective:  There were no vitals filed for this visit.  General: Well developed, nourished, in no acute distress, alert  and oriented x3   Dermatological: Skin is warm, dry and supple bilateral. Nails x 10 are well maintained; remaining integument appears unremarkable at this time. There are no open sores, no preulcerative lesions, no rash or signs of infection present.  Vascular: Dorsalis Pedis artery and Posterior Tibial artery pedal pulses are 2/4 bilateral with immedate capillary fill time. Pedal hair growth present. No varicosities and no lower extremity edema present bilateral.   Neruologic: Grossly intact via light touch bilateral. Vibratory intact via tuning fork bilateral. Protective threshold with Semmes Wienstein monofilament intact to all pedal sites bilateral. Patellar and Achilles deep tendon reflexes 2+ bilateral. No Babinski or clonus noted bilateral.   Musculoskeletal: No gross boney pedal deformities bilateral. No pain, crepitus, or limitation noted with foot and ankle range of motion bilateral. Muscular strength 5/5 in all groups tested bilateral.  Moderate to severe pain on palpation medial calcaneal tubercle of the left heel.  Gait: Unassisted, Nonantalgic.    Radiographs:  Radiographs taken today demonstrate osseously mature foot good bone mineralization.  Soft tissue increase in density plantar fascial canny insertion site of the left heel minimal spurring and no significant acute bony abnormalities.  Assessment & Plan:   Assessment: Plantar fasciitis left.  Plan: Discussed etiology pathology conservative surgical therapies at this point I injected her left heel today 20 mg Kenalog  5 mg Marcaine  for maximal tenderness.  Tolerated procedure well.  Started  her on methylprednisolone  to be followed by meloxicam .  Also put her in a plantar fascia brace.  Discussed appropriate shoe gear and I will follow-up with her in 1 month.     Johnie Stadel T. [place], [place]

## 2024-05-02 ENCOUNTER — Ambulatory Visit: Admitting: Podiatry

## 2024-05-02 DIAGNOSIS — M722 Plantar fascial fibromatosis: Secondary | ICD-10-CM

## 2024-05-02 MED ORDER — TRIAMCINOLONE ACETONIDE 40 MG/ML IJ SUSP
20.0000 mg | Freq: Once | INTRAMUSCULAR | Status: AC
  Administered 2024-05-02: 20 mg

## 2024-05-02 NOTE — Progress Notes (Signed)
 Please she presents today for a follow-up of her plantar fasciitis to her left heel.  She was last seen April 04, 2024.  She states that is 90% improved she still taking her anti-inflammatories and continue to wear her tennis shoes.  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  Objective: Vital signs are stable alert oriented x 3.  There is no erythema edema salines drainage or odor she is not warm to the touch though she does have some tenderness on palpation at the plantar fascia calcaneal insertion site.  Assessment: 90% resolution plantar fasciitis left foot.  Plan: Reinjected her left heel today 20 mg Kenalog  promoters Marcaine  for maximal tenderness.  Recommend she continue the anti-inflammatories for at least another month if any reoccurrence she is to notify us  immediately otherwise I will put her down for 6 weeks.  If she does not need to follow-up then she will cancel

## 2024-06-13 ENCOUNTER — Ambulatory Visit: Admitting: Podiatry
